# Patient Record
Sex: Male | Born: 1940
Health system: Southern US, Community
[De-identification: ages and names within clinical notes are randomized; demographics above are authoritative.]

## PROBLEM LIST (undated history)

## (undated) DIAGNOSIS — B0221 Postherpetic geniculate ganglionitis: Secondary | ICD-10-CM

## (undated) DIAGNOSIS — T7840XA Allergy, unspecified, initial encounter: Secondary | ICD-10-CM

## (undated) DIAGNOSIS — F419 Anxiety disorder, unspecified: Secondary | ICD-10-CM

## (undated) DIAGNOSIS — E785 Hyperlipidemia, unspecified: Secondary | ICD-10-CM

## (undated) DIAGNOSIS — K219 Gastro-esophageal reflux disease without esophagitis: Secondary | ICD-10-CM

## (undated) DIAGNOSIS — R000 Tachycardia, unspecified: Secondary | ICD-10-CM

## (undated) HISTORY — DX: Hyperlipidemia, unspecified: E78.5

## (undated) HISTORY — DX: Gastro-esophageal reflux disease without esophagitis: K21.9

## (undated) HISTORY — DX: Postherpetic geniculate ganglionitis: B02.21

## (undated) HISTORY — DX: Allergy, unspecified, initial encounter: T78.40XA

## (undated) HISTORY — PX: COLONOSCOPY: SHX174

## (undated) HISTORY — DX: Anxiety disorder, unspecified: F41.9

## (undated) HISTORY — PX: TRANSURETHRAL RESECTION OF PROSTATE: SHX73

## (undated) HISTORY — DX: Tachycardia, unspecified: R00.0

## (undated) HISTORY — PX: HERNIA REPAIR: SHX51

---

## 2000-12-02 ENCOUNTER — Encounter: Payer: Self-pay | Admitting: Emergency Medicine

## 2000-12-02 ENCOUNTER — Emergency Department (HOSPITAL_COMMUNITY): Admission: EM | Admit: 2000-12-02 | Discharge: 2000-12-02 | Payer: Self-pay | Admitting: Emergency Medicine

## 2004-08-13 ENCOUNTER — Ambulatory Visit: Payer: Self-pay | Admitting: Internal Medicine

## 2004-08-20 ENCOUNTER — Ambulatory Visit: Payer: Self-pay | Admitting: Internal Medicine

## 2006-09-01 DIAGNOSIS — B0221 Postherpetic geniculate ganglionitis: Secondary | ICD-10-CM

## 2006-09-01 HISTORY — DX: Postherpetic geniculate ganglionitis: B02.21

## 2007-02-06 ENCOUNTER — Ambulatory Visit: Payer: Self-pay | Admitting: Internal Medicine

## 2007-02-06 ENCOUNTER — Inpatient Hospital Stay (HOSPITAL_COMMUNITY): Admission: EM | Admit: 2007-02-06 | Discharge: 2007-02-08 | Payer: Self-pay | Admitting: Emergency Medicine

## 2007-02-07 ENCOUNTER — Ambulatory Visit: Payer: Self-pay | Admitting: Infectious Diseases

## 2009-08-23 ENCOUNTER — Encounter (INDEPENDENT_AMBULATORY_CARE_PROVIDER_SITE_OTHER): Payer: Self-pay | Admitting: *Deleted

## 2010-01-04 ENCOUNTER — Encounter (INDEPENDENT_AMBULATORY_CARE_PROVIDER_SITE_OTHER): Payer: Self-pay | Admitting: *Deleted

## 2010-01-09 ENCOUNTER — Encounter (INDEPENDENT_AMBULATORY_CARE_PROVIDER_SITE_OTHER): Payer: Self-pay

## 2010-01-11 ENCOUNTER — Ambulatory Visit: Payer: Self-pay | Admitting: Internal Medicine

## 2010-01-17 ENCOUNTER — Telehealth: Payer: Self-pay | Admitting: Internal Medicine

## 2010-03-07 ENCOUNTER — Ambulatory Visit: Payer: Self-pay | Admitting: Internal Medicine

## 2010-03-09 ENCOUNTER — Encounter: Payer: Self-pay | Admitting: Internal Medicine

## 2010-06-05 ENCOUNTER — Telehealth: Payer: Self-pay | Admitting: Internal Medicine

## 2010-06-05 DIAGNOSIS — K219 Gastro-esophageal reflux disease without esophagitis: Secondary | ICD-10-CM

## 2010-06-06 ENCOUNTER — Encounter (INDEPENDENT_AMBULATORY_CARE_PROVIDER_SITE_OTHER): Payer: Self-pay | Admitting: *Deleted

## 2010-06-07 ENCOUNTER — Ambulatory Visit: Payer: Self-pay | Admitting: Internal Medicine

## 2010-06-13 ENCOUNTER — Ambulatory Visit: Payer: Self-pay | Admitting: Internal Medicine

## 2010-06-13 LAB — CONVERTED CEMR LAB: UREASE: NEGATIVE

## 2010-06-14 ENCOUNTER — Encounter: Payer: Self-pay | Admitting: Internal Medicine

## 2010-07-24 ENCOUNTER — Ambulatory Visit: Payer: Self-pay | Admitting: Internal Medicine

## 2010-07-24 DIAGNOSIS — K222 Esophageal obstruction: Secondary | ICD-10-CM

## 2010-07-24 DIAGNOSIS — R1013 Epigastric pain: Secondary | ICD-10-CM

## 2010-07-30 ENCOUNTER — Ambulatory Visit (HOSPITAL_COMMUNITY)
Admission: RE | Admit: 2010-07-30 | Discharge: 2010-07-30 | Payer: Self-pay | Source: Home / Self Care | Admitting: Internal Medicine

## 2010-10-01 NOTE — Miscellaneous (Signed)
Summary: omeprazole prescription  Clinical Lists Changes  Medications: Added new medication of OMEPRAZOLE 40 MG  CPDR (OMEPRAZOLE) 1 each day 30 minutes before meal - Signed Rx of OMEPRAZOLE 40 MG  CPDR (OMEPRAZOLE) 1 each day 30 minutes before meal;  #30 x 11;  Signed;  Entered by: Laverna Peace RN;  Authorized by: Hilarie Fredrickson MD;  Method used: Electronically to [facility] [address] [place], [place], [place]  [id number], Ph: [phone], Fax: [phone]    Prescriptions: OMEPRAZOLE 40 MG  CPDR (OMEPRAZOLE) 1 each day 30 minutes before meal  #30 x 11   Entered by:   Laverna Peace RN   Authorized by:   Hilarie Fredrickson MD   Signed by:   Laverna Peace RN on 06/13/2010   Method used:   Electronically to        [facility] (retail)       [address]       [place], [place]  [id number]       Ph: [phone]       Fax: [phone]   RxID:   [id number]

## 2010-10-01 NOTE — Letter (Signed)
Summary: EGD Instructions  [facility]  [address] [place], [place] [address]   Phone: [phone]  Fax: [phone]       Timothy Horne    [birth year]    MRN: [id number]       Procedure Day Timothy Horne:  Timothy Horne  06/13/10     Arrival Time:  10:30AM     Procedure Time:  11:30AM     Location of Procedure:                    _ X _ [facility] (4th Floor)   PREPARATION FOR ENDOSCOPY   On 06/13/10 THE DAY OF THE PROCEDURE:  1.   No solid foods, milk or milk products are allowed after midnight the night before your procedure.  2.   Do not drink anything colored red or purple.  Avoid juices with pulp.  No orange juice.  3.  You may drink clear liquids until 9:30am, which is 2 hours before your procedure.                                                                                                CLEAR LIQUIDS INCLUDE: Water Jello Ice Popsicles Tea (sugar ok, no milk/cream) Powdered fruit flavored drinks Coffee (sugar ok, no milk/cream) Gatorade Juice: apple, white grape, white cranberry  Lemonade Clear bullion, consomm, broth Carbonated beverages (any kind) Strained chicken noodle soup Hard Candy   MEDICATION INSTRUCTIONS  Unless otherwise instructed, you should take regular prescription medications with a small sip of water as early as possible the morning of your procedure.       OTHER INSTRUCTIONS  You will need a responsible adult at least 70 years of age to accompany you and drive you home.   This person must remain in the waiting room during your procedure.  Wear loose fitting clothing that is easily removed.  Leave jewelry and other valuables at home.  However, you may wish to bring a book to read or an iPod/MP3 player to listen to music as you wait for your procedure to start.  Remove all body piercing jewelry and leave at home.  Total time from sign-in until discharge is approximately 2-3 hours.  You should go home  directly after your procedure and rest.  You can resume normal activities the day after your procedure.  The day of your procedure you should not:   Drive   Make legal decisions   Operate machinery   Drink alcohol   Return to work  You will receive specific instructions about eating, activities and medications before you leave.    The above instructions have been reviewed and explained to me by   Wyona Almas RN  June 07, 2010 2:03 PM     I fully understand and can verbalize these instructions _____________________________ Date _________

## 2010-10-01 NOTE — Miscellaneous (Signed)
Summary: clotest  Clinical Lists Changes  Orders: Added new Test order of TLB-H Pylori Screen Gastric Biopsy (83013-CLOTEST) - Signed 

## 2010-10-01 NOTE — Letter (Signed)
Summary: Appt Reminder 2  [facility]  [address] [place], [place] [address]   Phone: [phone]  Fax: [phone]        June 14, 2010 MRN: [id number]    [facility] [address] [place], [place]  [address]    Dear Mr. Appenzeller,   You have a return appointment with Dr. Wilhemina Bonito. Perry on 07/22/10 at 8:30  am.  Please remember to bring a complete list of the medicines you are taking, your insurance card and your co-pay.  If you have to cancel or reschedule this appointment, please call before 5:00 pm the evening before to avoid a cancellation fee.  If you have any questions or concerns, please call [phone].    Sincerely,    Milford Cage NCMA

## 2010-10-01 NOTE — Letter (Signed)
Summary: [name] Instructions  [name]  [address] [place], [place] [address]   Phone: [phone]  Fax: [phone]       Timothy Horne    [birth year]    MRN: [id number]        Procedure Day [name]:  Timothy Horne  01/18/10     Arrival Time:  8:00AM     Procedure Time:  9:00AM     Location of Procedure:                    _ X_  [facility] (4th Floor)                      PREPARATION FOR COLONOSCOPY WITH MOVIPREP   Starting 5 days prior to your procedure 5/15/11do not eat nuts, seeds, popcorn, corn, beans, peas,  salads, or any raw vegetables.  Do not take any fiber supplements (e.g. Metamucil, Citrucel, and Benefiber).  THE DAY BEFORE YOUR PROCEDURE         DATE: 01/17/10  DAY: THURSDAY  1.  Drink clear liquids the entire day-NO SOLID FOOD  2.  Do not drink anything colored red or purple.  Avoid juices with pulp.  No orange juice.  3.  Drink at least 64 oz. (8 glasses) of fluid/clear liquids during the day to prevent dehydration and help the prep work efficiently.  CLEAR LIQUIDS INCLUDE: Water Jello Ice Popsicles Tea (sugar ok, no milk/cream) Powdered fruit flavored drinks Coffee (sugar ok, no milk/cream) Gatorade Juice: apple, white grape, white cranberry  Lemonade Clear bullion, consomm, broth Carbonated beverages (any kind) Strained chicken noodle soup Hard Candy                             4.  In the morning, mix first dose of MoviPrep solution:    Empty 1 Pouch A and 1 Pouch B into the disposable container    Add lukewarm drinking water to the top line of the container. Mix to dissolve    Refrigerate (mixed solution should be used within 24 hrs)  5.  Begin drinking the prep at 5:00 p.m. The MoviPrep container is divided by 4 marks.   Every 15 minutes drink the solution down to the next mark (approximately 8 oz) until the full liter is complete.   6.  Follow completed prep with 16 oz of clear liquid of your choice (Nothing  red or purple).  Continue to drink clear liquids until bedtime.  7.  Before going to bed, mix second dose of MoviPrep solution:    Empty 1 Pouch A and 1 Pouch B into the disposable container    Add lukewarm drinking water to the top line of the container. Mix to dissolve    Refrigerate  THE DAY OF YOUR PROCEDURE      DATE: 01/18/10  DAY: FRIDAY  Beginning at 4:00AM (5 hours before procedure):         1. Every 15 minutes, drink the solution down to the next mark (approx 8 oz) until the full liter is complete.  2. Follow completed prep with 16 oz. of clear liquid of your choice.    3. You may drink clear liquids until 7:00AM (2 HOURS BEFORE PROCEDURE).   MEDICATION INSTRUCTIONS  Unless otherwise instructed, you should take regular prescription medications with a small sip of water   as early as possible the morning of your procedure.  OTHER INSTRUCTIONS  You will need a responsible adult at least 70 years of age to accompany you and drive you home.   This person must remain in the waiting room during your procedure.  Wear loose fitting clothing that is easily removed.  Leave jewelry and other valuables at home.  However, you may wish to bring a book to read or  an iPod/MP3 player to listen to music as you wait for your procedure to start.  Remove all body piercing jewelry and leave at home.  Total time from sign-in until discharge is approximately 2-3 hours.  You should go home directly after your procedure and rest.  You can resume normal activities the  day after your procedure.  The day of your procedure you should not:   Drive   Make legal decisions   Operate machinery   Drink alcohol   Return to work  You will receive specific instructions about eating, activities and medications before you leave.    The above instructions have been reviewed and explained to me by   [name]  Jan 11, 2010 2:53 PM_    I fully understand and can  verbalize these instructions _____________________________ Date _________

## 2010-10-01 NOTE — Letter (Signed)
Summary: Patient Notice- Polyp Results  [facility]  [address] [place], [place] [address]   Phone: [phone]  Fax: [phone]        March 09, 2010 MRN: [id number]    [facility] [address] [place], [place]  [address]    Dear Mr. Seeber,  I am pleased to inform you that the colon polyp(s) removed during your recent colonoscopy was (were) found to be benign (no cancer detected) upon pathologic examination.  I recommend you have a repeat colonoscopy examination in 5 years to look for recurrent polyps, as having colon polyps increases your risk for having recurrent polyps or even colon cancer in the future.  Should you develop new or worsening symptoms of abdominal pain, bowel habit changes or bleeding from the rectum or bowels, please schedule an evaluation with either your primary care physician or with me.  Additional information/recommendations:  __ No further action with gastroenterology is needed at this time. Please      follow-up with your primary care physician for your other healthcare      needs.   Please call us if you are having persistent problems or have questions about your condition that have not been fully answered at this time.  Sincerely,  Hilarie Fredrickson MD  This letter has been electronically signed by your physician.  Appended Document: Patient Notice- Polyp Results letter mailed .

## 2010-10-01 NOTE — Progress Notes (Signed)
Summary: cx fee?  Phone Note Call from Patient   Caller: Patient Call For: Dr. Marina Goodell Reason for Call: Talk to Doctor Summary of Call: pt canceled his procedure sch'ed for tomorrow due to his wife having to be admitted to the hospital today and will be staying over night... pt did resch for late in June  Dr. Marina Goodell, do you wish to charge this pt? Initial call taken by: Vallarie Mare,  Jan 17, 2010 9:49 AM  Follow-up for Phone Call        no Follow-up by: Hilarie Fredrickson MD,  Jan 17, 2010 9:51 AM  Additional Follow-up for Phone Call Additional follow up Details #1::        Patient NOT BILLED. Additional Follow-up by: Leanor Kail [facility],  Jan 22, 2010 8:59 AM

## 2010-10-01 NOTE — Assessment & Plan Note (Signed)
Summary: Followup post EGD (epigastric pain and dysphagia)   History of Present Illness Visit Type: follow up Primary GI MD: Yancey Flemings MD Primary Provider: Sondra Come, MD Chief Complaint: f/u EGD, still having epigastric and upper abd pain that can last 2 hours at a time. Pt does have some indigestion.  History of Present Illness:   70 year old with GERD, peptic stricture, adenomatous colon polyps, and chronic dyspeptic symptoms. He underwent surveillance colonoscopy in July 2011. Small tubular adenomas removed. Follow up in 5 years recommended. He subsequently contacted the office complaining of abdominal complaints and dysphagia. He subsequently underwent upper endoscopy October 13. He was found to have distal esophagitis, a benign esophageal stricture, and mild duodenitis. Testing for H. pylori was negative. Omeprazole 40 mg daily prescribed. Abdominal ultrasound requested. Outpatient followup at this time. Patient tells me that he continues to have some epigastric and upper abdominal discomfort that can last for 2 hours at a time. He's had this problem chronically for several years. She does mention that his dysphagia has resolved post esophageal dilation Elease Hashimoto [address] [place] dilator). He thinks the omeprazole resulted in more abdominal discomfort. Thus, he is on no acid suppressive therapy . No complaints of weight loss or bleeding.Marland Kitchen   GI Review of Systems    Reports abdominal pain and  belching.     Location of  Abdominal pain: epigastric area and uper abd.    Denies acid reflux, chest pain, dysphagia with liquids, dysphagia with solids, heartburn, loss of appetite, nausea, vomiting, vomiting blood, weight loss, and  weight gain.        Denies anal fissure, black tarry stools, change in bowel habit, constipation, diarrhea, diverticulosis, fecal incontinence, heme positive stool, hemorrhoids, irritable bowel syndrome, jaundice, light color stool, liver problems, rectal bleeding, and  rectal  pain. Preventive Screening-Counseling & Management  Alcohol-Tobacco     Smoking Status: never      Drug Use:  no.      Current Medications (verified): 1)  Tenormin 50 Mg Tabs (Atenolol) .... One Capsule By Mouth Once Daily  Allergies (verified): No Known Drug Allergies  Past History:  Past Medical History: Reviewed history from 07/23/2010 and no changes required. Diverticulosis Hemorrhoids Kidney Stones Hx. of Esophageal Stricture GERD Colon Polyps-Tubular Adenoma  Hx. of Hyperplastic   Past Surgical History: Reviewed history from 07/23/2010 and no changes required. Deviated Septum Repair Inguinal Hernia Repair  Family History: Family History of Stomach Cancer:Aunt  Social History: Married Patient has never smoked.  Alcohol Use - yes Illicit Drug Use - no Smoking Status:  never Drug Use:  no  Review of Systems  The patient denies allergy/sinus, anemia, anxiety-new, arthritis/joint pain, back pain, blood in urine, breast changes/lumps, change in vision, confusion, cough, coughing up blood, depression-new, fainting, fatigue, fever, headaches-new, hearing problems, heart murmur, heart rhythm changes, itching, menstrual pain, muscle pains/cramps, night sweats, nosebleeds, pregnancy symptoms, shortness of breath, skin rash, sleeping problems, sore throat, swelling of feet/legs, swollen lymph glands, thirst - excessive , urination - excessive , urination changes/pain, urine leakage, vision changes, and voice change.    Vital Signs:  Patient profile:   70 year old male Height:      71 inches Weight:      168.38 pounds BMI:     23.57 Pulse rate:   88 / minute Pulse rhythm:   regular BP sitting:   158 / 82  (right arm) Cuff size:   regular  Vitals Entered By: Christie Nottingham CMA Duncan Dull) (July 24, 2010 11:37 AM)  Physical Exam  General:  Well developed, well nourished, no acute distress. Head:  Normocephalic and atraumatic. Eyes:  PERRLA, no icterus. Mouth:   No deformity or lesions. Neck:  Supple; no masses or thyromegaly. Lungs:  Clear throughout to auscultation. Heart:  Regular rate and rhythm; no murmurs, rubs,  or bruits. Abdomen:  Soft, nontender and nondistended. No masses, hepatosplenomegaly or hernias noted. Normal bowel sounds. Pulses:  Normal pulses noted. Neurologic:  Alert and  oriented x4. Skin:  Intact without significant lesions or rashes. Psych:  Alert and cooperative. Normal mood and affect.   Impression & Recommendations:  Problem # 1:  ABDOMINAL PAIN, EPIGASTRIC (ICD-789.06) ongoing dyspeptic complaints. Service some of his symptoms may be due to GERD. He has been on no reasonable last suppressive therapy as he feels PPIs neck symptoms worse (despite using for only short periods of time).  Plan: #1. Prescribed ranitidine 150 mg p.o. b.i.d. #2. Scheduled normal ultrasound to evaluate for other causes of discomfort #3. If ultrasound negative, then followup p.r.n.  Problem # 2:  ESOPHAGEAL STRICTURE (ICD-530.3) dysphagia resolved post dilation. Repeat dilation p.r.n. recurrent dysphagia  Problem # 3:  GERD (ICD-530.81) endoscopic evidence of esophagitis as well as peptic stricture.  Plan: #1. Prescribed ranitidine as patient unwilling to take PPI #2. Reflux precautions #3. Follow p.r.n.  Other Orders: Ultrasound Abdomen (UAS)  Patient Instructions: 1)  Abdominal Ultrasound [facility] 07/30/10 11:00 am arrive at 10:45 am 2)  Zantac 150 mg two times a day buy over the counter. 3)  Copy sent to : Sondra Come, MD 4)  The medication list was reviewed and reconciled.  All changed / newly prescribed medications were explained.  A complete medication list was provided to the patient / caregiver.

## 2010-10-01 NOTE — Procedures (Signed)
Summary: Colonoscopy  Patient: Timothy Horne Note: All result statuses are Final unless otherwise noted.  Tests: (1) Colonoscopy (COL)   COL Colonoscopy           DONE     [facility]     [address]     [place], [place]  [address]           COLONOSCOPY PROCEDURE REPORT           PATIENT:  Timothy Horne, Timothy Horne  MR#:  [id number]     BIRTHDATE:  [birth year], 68 yrs. old  GENDER:  male     ENDOSCOPIST:  Wilhemina Bonito. Eda Keys, MD     REF. BY:  Surveillance Program Recall,     PROCEDURE DATE:  03/07/2010     PROCEDURE:  Colonoscopy with snare polypectomy x 3     ASA CLASS:  Class II     INDICATIONS:  surveillance and high-risk screening, history of     pre-cancerous (adenomatous) colon polyps ; 08-2004 w/ TA     MEDICATIONS:   Fentanyl 125 mcg IV, Versed 12 mg IV           DESCRIPTION OF PROCEDURE:   After the risks benefits and     alternatives of the procedure were thoroughly explained, informed     consent was obtained.  Digital rectal exam was performed and     revealed no abnormalities.   The LB CF-H180AL [id number] endoscope     was introduced through the anus and advanced to the cecum, which     was identified by both the appendix and ileocecal valve, without     limitations.Time to cecum = 3:09 min.  The quality of the prep was     excellent, using MoviPrep.  The instrument was then slowly     withdrawn (time = 11:51 min.) as the colon was fully examined.     <<PROCEDUREIMAGES>>           FINDINGS:  Three polyps (all <34mm) were found in the transverse,     descending, and sigmoid colon. Polyps were snared without cautery.     Retrieval was successful.  Moderate diverticulosis was found in     the sigmoid colon.  This was otherwise a normal examination of the     colon.   Retroflexed views in the rectum revealed internal     hemorrhoids.    The scope was then withdrawn from the patient and     the procedure completed.           COMPLICATIONS:  None     ENDOSCOPIC  IMPRESSION:     1) Three polyps - removed     2) Moderate diverticulosis in the sigmoid colon     3) Otherwise normal examination     4) Internal hemorrhoids           RECOMMENDATIONS:     1) Follow up colonoscopy in 3 years if all polyps adenomas;     otherwise 5 years           ______________________________     Wilhemina Bonito. Eda Keys, MD           CC:  Sondra Come MD [facility]); The Patient           n.     eSIGNED:   Tkeya Stencil N. Eda Keys at 03/07/2010 12:43 PM           Gracia, Chase Picket, [id number]  Note: An  exclamation mark (!) indicates a result that was not dispersed into the flowsheet. Document Creation Date: 03/07/2010 12:45 PM _______________________________________________________________________  (1) Order result status: Final Collection or observation date-time: 03/07/2010 12:36 Requested date-time:  Receipt date-time:  Reported date-time:  Referring Physician:   Ordering Physician: Fransico Setters [phone]) Specimen Source:  Source: Launa Grill Order Number: [id number] Lab site:   Appended Document: Colonoscopy recall in 5 yr     Procedures Next Due Date:    Colonoscopy: 03/2015

## 2010-10-01 NOTE — Procedures (Signed)
Summary: Upper Endoscopy  Patient: Timothy Horne Note: All result statuses are Final unless otherwise noted.  Tests: (1) Upper Endoscopy (EGD)   EGD Upper Endoscopy       DONE     [facility]     [address]     [place], [place]  [address]           ENDOSCOPY PROCEDURE REPORT           PATIENT:  [name], [name]  MR#:  [id number]     BIRTHDATE:  18-Aug-1941, 69 yrs. old  GENDER:  male           ENDOSCOPIST:  Wilhemina Bonito. Eda Keys, MD     Referred by:  .Direct           PROCEDURE DATE:  06/13/2010     PROCEDURE:  EGD with biopsy,     Maloney Dilation of Esophagus - 30F           ASA CLASS:  Class II           INDICATIONS:  epigastric pain, dysphagia ; Intermittent epigastric     pain lasting 2-3 hours once every several months for the past     year; int. solid food dysphagia; also c/o "burning" and belching           MEDICATIONS:   Fentanyl 75 mcg IV, Versed 9 mg IV     TOPICAL ANESTHETIC:  Exactacain Spray           DESCRIPTION OF PROCEDURE:   After the risks benefits and     alternatives of the procedure were thoroughly explained, informed     consent was obtained.  The LB GIF-H180 [id number] endoscope was     introduced through the mouth and advanced to the second portion of     the duodenum, without limitations.  The instrument was slowly     withdrawn as the mucosa was fully examined.     <<PROCEDUREIMAGES>>           Esophagitis (erythema and edema) was found in the distal     esophagus.  A 15mm benign ringlike stricture was found in the     distal esophagus.  The stomach was entered and closely examined.     The antrum, angularis, and lesser curvature were well visualized,     including a retroflexed view of the cardia and fundus. The stomach     wall was normally distensable. The scope passed easily through the     pylorus into the duodenum.  Duodenitis was found in the bulb of     the duodenum. Clo bx taken. Normal D2.   Retroflexed views     revealed no  abnormalities.    The scope was then withdrawn from     the patient and the procedure completed.           THERAPY: 54 F MALONEY DILATOR PASSED W/ MINIMAL RESISTANCE AND NO     HEME. TOLERATED WELL           COMPLICATIONS:  None           ENDOSCOPIC IMPRESSION:     1) Esophagitis in the distal esophagus     2) Stricture in the distal esophagus - s/p dilation 30F     3) Normal stomach     4) Duodenitis in the bulb of duodenum     5) GERD     RECOMMENDATIONS:  1) OMEPRAZOLE 40MG  PO QD; #30; 11 REFILLS     2) Rx CLO if positive     3) My office will arrange for you to have an abdominal     ultrasound performed "EPIGASRTIC PAIN".     4) OP OFFICE follow-up in 4-6  weeks WITH DR Marina Goodell.           ______________________________     Wilhemina Bonito. Eda Keys, MD           CC:  The Patient; Cain Saupe, MD ([facility])           n.     eSIGNED:   Wilhemina Bonito. Eda Keys at 06/13/2010 12:22 PM           Mitter, [place], [id number]  Note: An exclamation mark (!) indicates a result that was not dispersed into the flowsheet. Document Creation Date: 06/13/2010 12:23 PM _______________________________________________________________________  (1) Order result status: Final Collection or observation date-time: 06/13/2010 12:12 Requested date-time:  Receipt date-time:  Reported date-time:  Referring Physician:   Ordering Physician: Fransico Setters [phone]) Specimen Source:  Source: Launa Grill Order Number: [id number] Lab site:

## 2010-10-01 NOTE — Letter (Signed)
Summary: Previsit letter  [facility]  [address] [place], [place] [address]   Phone: [phone]  Fax: [phone]       01/04/2010 MRN: [id number]  [facility] [address] [place], [place]  [address]  Dear Mr. Timothy Horne,  Welcome to the Gastroenterology Division at [facility].    You are scheduled to see a nurse for your pre-procedure visit on 01/11/2010 at 2:30pm on the 3rd floor at [facility], [address]  We ask that you try to arrive at our office 15 minutes prior to your appointment time to allow for check-in.  Your nurse visit will consist of discussing your medical and surgical history, your immediate family medical history, and your medications.    Please bring a complete list of all your medications or, if you prefer, bring the medication bottles and we will list them.  We will need to be aware of both prescribed and over the counter drugs.  We will need to know exact dosage information as well.  If you are on blood thinners (Coumadin, Plavix, Aggrenox, Ticlid, etc.) please call our office today/prior to your appointment, as we need to consult with your physician about holding your medication.   Please be prepared to read and sign documents such as consent forms, a financial agreement, and acknowledgement forms.  If necessary, and with your consent, a friend or relative is welcome to sit-in on the nurse visit with you.  Please bring your insurance card so that we may make a copy of it.  If your insurance requires a referral to see a specialist, please bring your referral form from your primary care physician.  No co-pay is required for this nurse visit.     If you cannot keep your appointment, please call [phone] to cancel or reschedule prior to your appointment date.  This allows [place] the opportunity to schedule an appointment for another patient in need of care.    Thank you for choosing [facility] for your  medical needs.  We appreciate the opportunity to care for you.  Please visit [place] at our website  to learn more about our practice.                     Sincerely.                                                                                                                   The Gastroenterology Division

## 2010-10-01 NOTE — Progress Notes (Signed)
Summary: Sch'd Endo  Phone Note Call from Patient Call back at Home Phone [phone]   Caller: Patient Call For: Dr. Marina Goodell Reason for Call: Talk to Nurse Summary of Call: Pt. said last OV he talked to Dr. Marina Goodell about a direct Endo...wants to know if he can sch'd directly Initial call taken by: Karna Christmas,  June 05, 2010 10:40 AM  Follow-up for Phone Call        Dr Marina Goodell this pt had a colon 03/07/10 and says that you ok'd a direct EGD.  Is this ok to schedule directly or would you like to see him in the office first? Follow-up by: Chales Abrahams CMA Duncan Dull),  June 05, 2010 11:12 AM  Additional Follow-up for Phone Call Additional follow up Details #1::        Find out why we wanted to do an EGD. What reason? Thanks.Hilarie Fredrickson MD  June 05, 2010 11:45 AM   New Problems: GERD (ICD-530.81)   Additional Follow-up for Phone Call Additional follow up Details #2::    Pt has a burning in his stomach that last for several hours.  Prilosec and Prevacid dose not help.  Occasionally has pain that feels like it goes from the stomach up to the throat.  Chales Abrahams CMA (AAMA)  June 05, 2010 1:21 PM   YES. OK TO SET UP EGD. Hilarie Fredrickson MD  June 05, 2010 2:10 PM  pt scheduled for EGD and previsit. Chales Abrahams CMA Duncan Dull)  June 05, 2010 2:26 PM    New Problems: GERD (ICD-530.81)

## 2010-10-01 NOTE — Miscellaneous (Signed)
Summary: [facility] Previsit/prep  Clinical Lists Changes  Observations: Added new observation of NKA: T (06/07/2010 13:34)

## 2010-10-01 NOTE — Miscellaneous (Signed)
Summary: [facility] previsit  Clinical Lists Changes  Medications: Added new medication of MOVIPREP 100 GM  SOLR (PEG-KCL-NACL-NASULF-NA ASC-C) As per prep instructions. - Signed Rx of MOVIPREP 100 GM  SOLR (PEG-KCL-NACL-NASULF-NA ASC-C) As per prep instructions.;  #1 x 0;  Signed;  Entered by: [name];  Authorized by: Hilarie Fredrickson MD;  Method used: Electronically to [facility] [address], [place], [place], [place]  [id number], Ph: [phone], Fax: [phone] Observations: Added new observation of NKA: T (01/11/2010 14:30)    Prescriptions: MOVIPREP 100 GM  SOLR (PEG-KCL-NACL-NASULF-NA ASC-C) As per prep instructions.  #1 x 0   Entered by:   [name]   Authorized by:   Hilarie Fredrickson MD   Signed by:   [name] on 01/11/2010   Method used:   Electronically to        [facility] (retail)       [address]       [place], [place]  [id number]       Ph: [phone]       Fax: [phone]   RxID:   [id number]

## 2011-01-14 NOTE — Discharge Summary (Signed)
NAMEELAI, VANWYK            ACCOUNT NO.:  [id number]   MEDICAL RECORD NO.:  [id number]          PATIENT TYPE:  INP   LOCATION:  2004                         FACILITY[facility]  [facility]   PHYSICIAN:  Dellia Beckwith, M.D. DATE OF BIRTH:  [birth year]   DATE OF ADMISSION:  02/06/2007  DATE OF DISCHARGE:  02/08/2007                               DISCHARGE SUMMARY   CHIEF COMPLAINT:  Ear pain, dizziness, paced changes and loss of  appetite.   DISCHARGE DIAGNOSES:  Primary:  1. 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  Secondary:  1. Aseptic meningitis.  2. Degenerative disc disease.  3. History of renal stones.  4. Status post hernia repair.  5. Status post transurethral prostatectomy.   DISCHARGE MEDICATIONS:  1. Tenormin 50 mg 1 per day.  2. Allegra 90 mg 1 per day.  3. Amitriptyline 50 mg 1 time each night.  4. Acyclovir 680 mg IV administered 1 dose every 8 hours for 19 days      (last dose on June 28).  5. Prednisone 60 mg 1 per day x4 days.   DISPOSITION AND FOLLOWUP:  1. Discharged to home with PICC line to receive IV acyclovir 3 times a      day, administered by home health for 19 days, and to receive      prednisone 60 mg orally for 4 days.  2. Follow up with Dr. Pearlean Brownie at [facility] immediately upon      discharge (June 9) for evaluation of blurry vision.  3. Follow up with Dr. Fara Boros at [facility].  Followup on      polyneuritis and aseptic meningitis, and check for VZV (Varicella-      Zoster virus) and HSV (herpes simplex virus) PCR ([employer] [place] of CSF [facility] [place]).  4. Also followup on chest x-ray findings of hyperinflation and      attenuation of the pulmonary vascular consistent with COPD; patient      is a lifetime nonsmoker with a family history of COPD.   PROCEDURES PERFORMED:  1. Lumbar puncture.  2. PICC line placement.   CONCLUSION:  Dr. Maurice March, infectious  disease.   ADMITTING H&P:  HPI:  On May 19, the patient began having pain behind  his left ear.  This continued until he awoke in the morning on May 21,  with left hearing loss, fever, dizziness and nausea.  He was seen at  urgent care, where he was given Cipro to treat a possible inner ear  infection.  Over the next few days, he continued to decline until he  reported to the ER at [facility] on May 24, where he was started  on acyclovir for shingles.  He continued to worsen and saw an ENT  physician on May 26, who told him he had inflammation of the cartilage  of the inner ear; he was started on prednisone and Keflex.  Following  this, his ear swelling improved, but he noted difficulty with a  accommodation, dizziness and abnormal taste.  On  June 2, he returned to  the ENT where he had an ESR and CBC drawn, both of which turned out to  be normal.  He also had an MRI, which showed enhancement of the left  cranial nerve, possibly representing an inflammation or a neuroma.   PHYSICAL EXAMINATION:  VITAL SIGNS:  Temperature 98.2, blood pressure  135/91, pulse 74, respiratory rate 16, O2 saturation 97% on room air.  GENERAL:  Uncomfortable appearing man lying in bed.  HEENT:  Eyes:  PERRLA, EOMI, nonicteric, right eye ptosis.  ENT:  Oropharynx clear, right TM clear, left TM with some erythema and  discoloration on TM, without any air fluid level, opacity, bulging,  retraction, or scarring.  No inner ear rashes.  NECK:  No thyromegaly.  No  lymphadenopathy.  Supple with full range of  motion.  RESPIRATORY:  Clear to auscultation bilaterally.  CV:  Regular rate and rhythm.  No murmurs, rubs or gallops.  GI:  Abdomen is soft, nontender, nondistended, positive bowel sounds.  EXTREMITIES:  No clubbing, cyanosis or edema.  SKIN:  No rashes.  NEURO:  Mental status intact and appropriate.  Weakness of closure of  the left eyelid.  Extraocular motor intact.  Facial sensation intact and   symmetric.  Hearing grossly intact.  Tongue mobile without  fasciculations or atrophy.  No deviation of palate.  Accessory muscles  5/5.  Strength 5/5 throughout.  Sensory grossly intact throughout.  Rapid alternating movements within normal limits.  Positive Romberg  sign.  No pronator drift.   ADMISSION LABORATORY DATA:  CBC:  White blood cells 8.4, hemoglobin  17.0, hematocrit 51.0, platelets 270.  Basic metabolic panel:  Sodium  134, potassium 4.3, chloride 102, bicarb 27, BUN 26, creatinine 1.1,  glucose 104.  CSF:  WBC 47.0, 92% lymphocytes, 0 PMNs, 0 eosinophils.  Glucose 61, protein 61, RBC 6.  Gram-stain showed 0 organisms.  VDRL  negative.  AFB negative.  HIV nonreactive.  RPR nonreactive.  Cryptococcal antigen negative.   HOSPITAL COURSE:  Patient was a 70 year old man with a 3 week history  significant for ear pain, nausea, vomiting, unilateral hearing loss,  imbalance, blurry vision and facial weakness.  He received an LP showing  elevated WBC with a lymphocytic predominance, elevated protein and  normal glucose.  These laboratory findings were consistent with aseptic  meningitis.  The patient had no history of NSAIDs use, IVDA, high-risk  sexual behavior, incarceration or foreign travel.  His CSF was tested  for markers of Syphilis, HIV, HSV, VZV and cryptococcus.  All of these  returned negative with the exception  of VZV and HSV, which are pending  at the time of discharge.  While the diagnosis of viral meningitis was  consistent with the LP findings, it did not explain his focal findings,  with suggestive cranial nerve pathology.  The symptoms described closely  resemble the constellation of symptoms described as Margaretha Sheffield  syndrome, the cranial neuropathy caused by HSV or VZV.  The patient was  begun on prednisone and acyclovir.  In light of the possibility of viral  meningitis, his antiviral therapy was changed to IV acyclovir, and a  PICC line was placed prior to  discharge.  Several of his symptoms  improved somewhat over the brief admission; however, he continued to  complain of blurry vision that improved-but did not resolve-when  covering 1 eye.  An appointment was made for him to see Dr. Pearlean Brownie at  [facility] immediately upon  discharge.  Followup was arranged  with Dr. Fara Boros with [facility] and this was  arranged for June 13.   DISCHARGE VITAL SIGNS:  Temperature 97.7, pulse 71, respiratory rate 18,  blood pressure 113/67, O2 saturation 95% on room air.   DISCHARGE DAY LABORATORIES:  CBC:  White blood cells 8.4, hemoglobin  13.4, hematocrit 39.7, platelets 204.   BMET:  Sodium 136, potassium 4.3, chloride 106, bicarb 26, BUN 17,  creatinine 0.9, glucose 99.      Dellia Beckwith, M.D.  Electronically Signed     VD/MEDQ  D:  02/08/2007  T:  02/08/2007  Job:  [id number]   cc:   Fara Boros, M.D.  Pearlean Brownie, M.D.

## 2011-06-19 LAB — URINALYSIS, MICROSCOPIC ONLY
Bilirubin Urine: NEGATIVE
Glucose, UA: NEGATIVE
Ketones, ur: NEGATIVE
Protein, ur: NEGATIVE
Urobilinogen, UA: 0.2

## 2011-06-19 LAB — AFB CULTURE WITH SMEAR (NOT AT ARMC): Acid Fast Smear: NONE SEEN

## 2011-06-19 LAB — CSF CULTURE W GRAM STAIN

## 2011-06-19 LAB — BASIC METABOLIC PANEL
CO2: 26
Calcium: 8.9
Chloride: 103
Chloride: 106
Creatinine, Ser: 1.17
GFR calc Af Amer: >60
GFR calc non Af Amer: >60
Glucose, Bld: 99
Potassium: 4.3
Sodium: 136
Sodium: 137

## 2011-06-19 LAB — URINE CULTURE
Colony Count: NO GROWTH
Culture: NO GROWTH

## 2011-06-19 LAB — CSF CELL COUNT WITH DIFFERENTIAL
Eosinophils, CSF: 0
Lymphs, CSF: 92 — ABNORMAL HIGH
Monocyte-Macrophage-Spinal Fluid: 8 — ABNORMAL LOW
RBC Count, CSF: 6 — ABNORMAL HIGH
Tube #: 4
WBC, CSF: 47 — ABNORMAL HIGH

## 2011-06-19 LAB — CBC
HCT: 39.7
HCT: 50.8
Hemoglobin: 13.4
MCHC: 33.7
MCV: 89.2
MCV: 89.8
Platelets: 270
RBC: 4.92
RDW: 12.6
WBC: 10.2
WBC: 8.4

## 2011-06-19 LAB — DIFFERENTIAL
Eosinophils Absolute: 0.1
Eosinophils Relative: 1
Lymphs Abs: 1.2
Lymphs Abs: 1.8
Monocytes Absolute: 0.7
Monocytes Relative: 2 — ABNORMAL LOW
Monocytes Relative: 8
Neutro Abs: 8.8 — ABNORMAL HIGH
Neutrophils Relative %: 86 — ABNORMAL HIGH

## 2011-06-19 LAB — CULTURE, BLOOD (ROUTINE X 2)

## 2011-06-19 LAB — CRYPTOCOCCAL ANTIGEN, CSF: Crypto Ag: NEGATIVE

## 2011-06-19 LAB — VDRL, CSF: VDRL Quant, CSF: NONREACTIVE

## 2011-06-19 LAB — I-STAT 8, (EC8 V) (CONVERTED LAB)
BUN: 26 — ABNORMAL HIGH
Chloride: 102
Glucose, Bld: 104 — ABNORMAL HIGH
Potassium: 4.3
pH, Ven: 7.427 — ABNORMAL HIGH

## 2011-06-19 LAB — HSV PCR: HSV, PCR: NEGATIVE

## 2011-06-19 LAB — COMPREHENSIVE METABOLIC PANEL
ALT: 36
BUN: 25 — ABNORMAL HIGH
CO2: 31
Calcium: 9.2
GFR calc non Af Amer: 57 — ABNORMAL LOW
Glucose, Bld: 117 — ABNORMAL HIGH
Sodium: 138

## 2011-06-19 LAB — SEDIMENTATION RATE: Sed Rate: 4

## 2011-06-19 LAB — HIV ANTIBODY (ROUTINE TESTING W REFLEX): HIV: NONREACTIVE

## 2011-06-19 LAB — POCT I-STAT CREATININE: Operator id: 291361

## 2011-06-19 LAB — GRAM STAIN

## 2011-06-19 LAB — VARICELLA-ZOSTER BY PCR: Varicella-Zoster, PCR: NEGATIVE

## 2011-06-19 LAB — PROTEIN, CSF: Total  Protein, CSF: 61 — ABNORMAL HIGH

## 2011-06-19 LAB — GLUCOSE, CSF: Glucose, CSF: 61

## 2011-07-06 IMAGING — US US ABDOMEN COMPLETE
1 series · 14 of 25 positions shown · non-contrast
Comparison: None

CLINICAL DATA: Epigastric pain.

COMPLETE ABDOMINAL ULTRASOUND

[Series 1: us abdomen complete · 0.23mm/px · 14 of 88 slices shown]
[im 1/88]
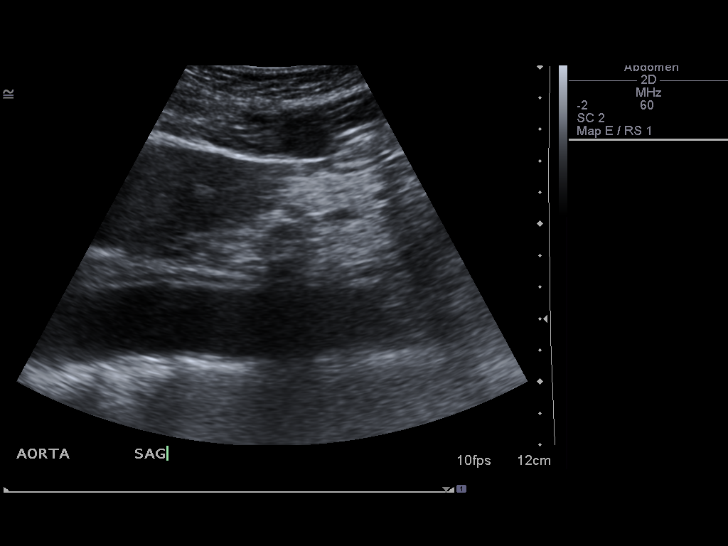
[im 8/88]
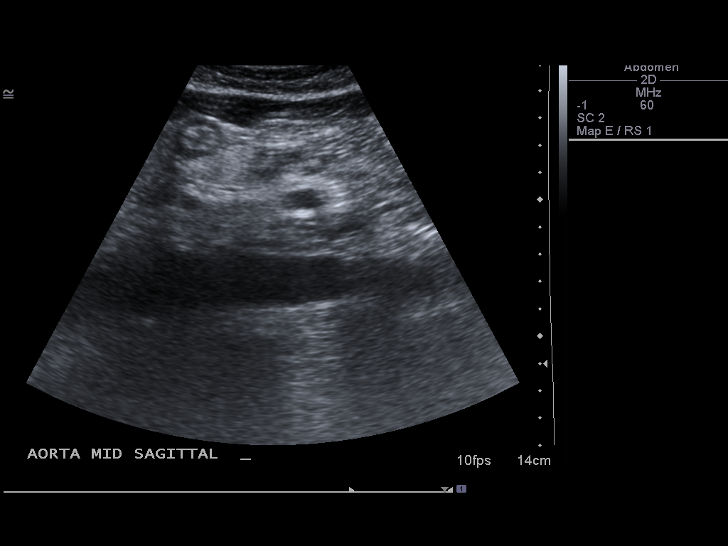
[im 15/88]
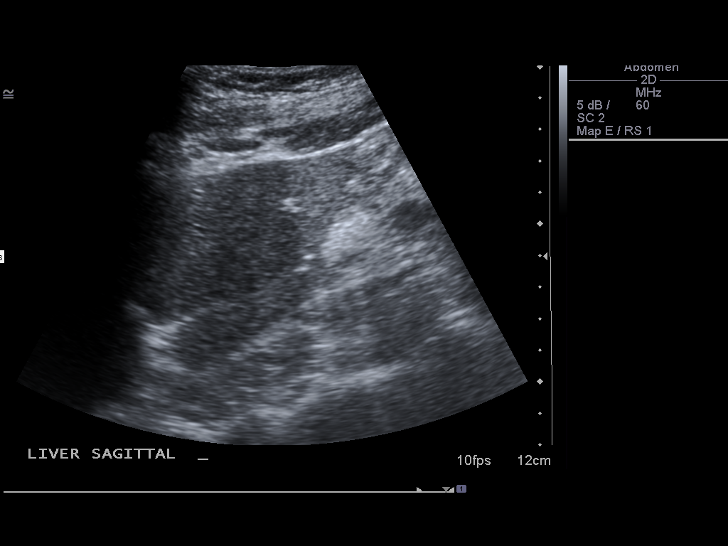
[im 22/88]
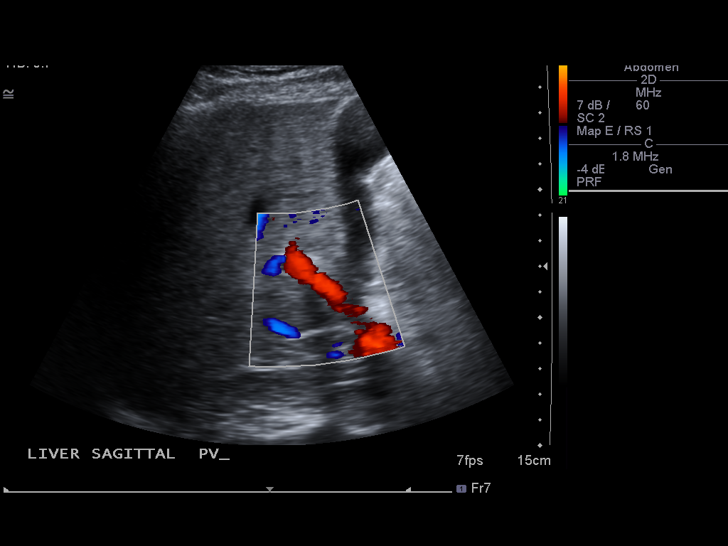
[im 30/88]
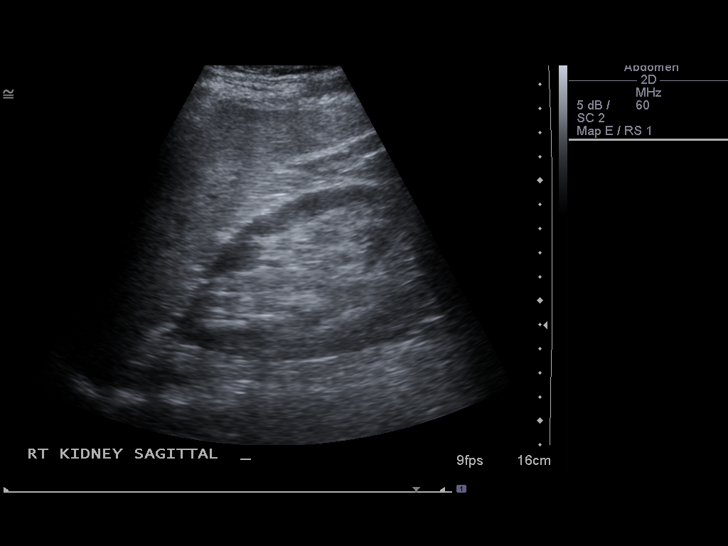
[im 33/88]
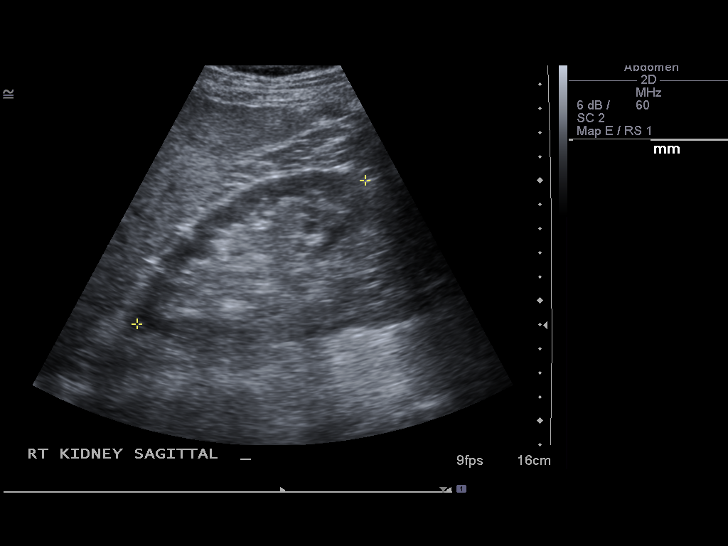
[im 40/88]
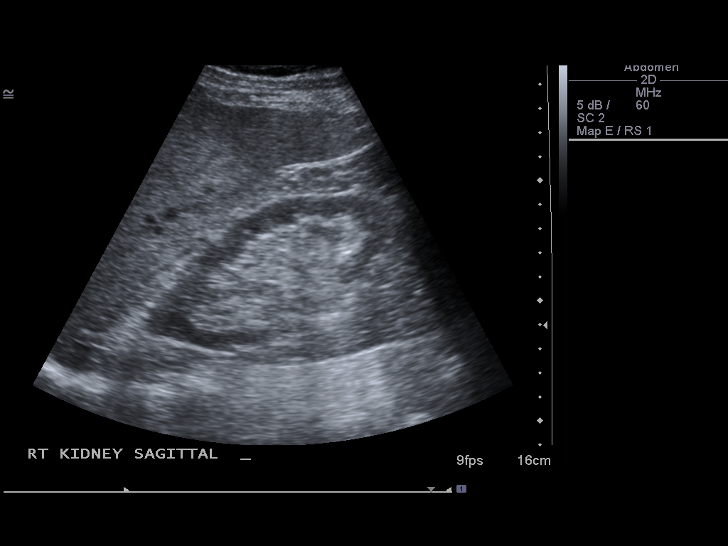
[im 48/88]
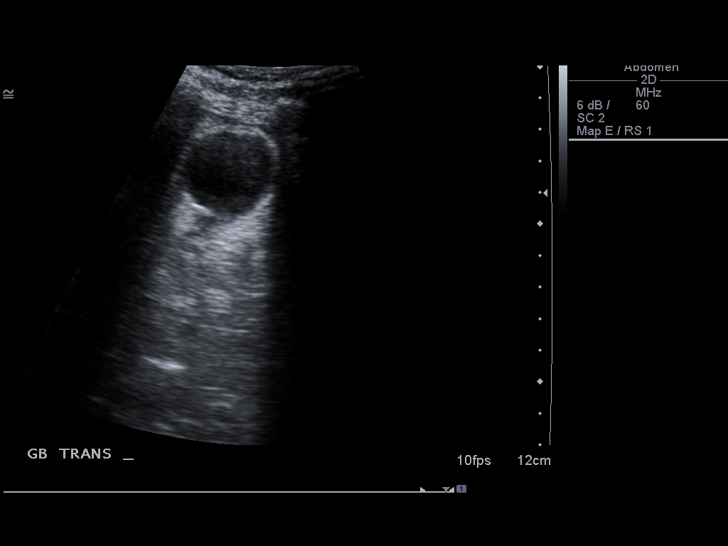
[im 55/88]
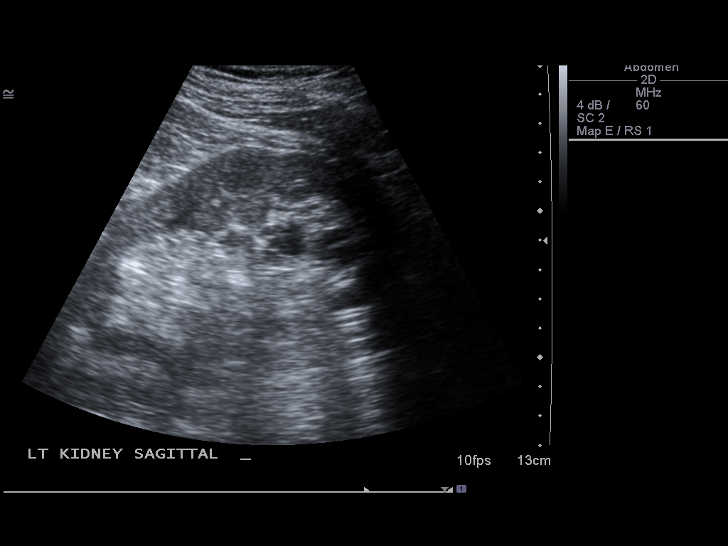
[im 59/88]
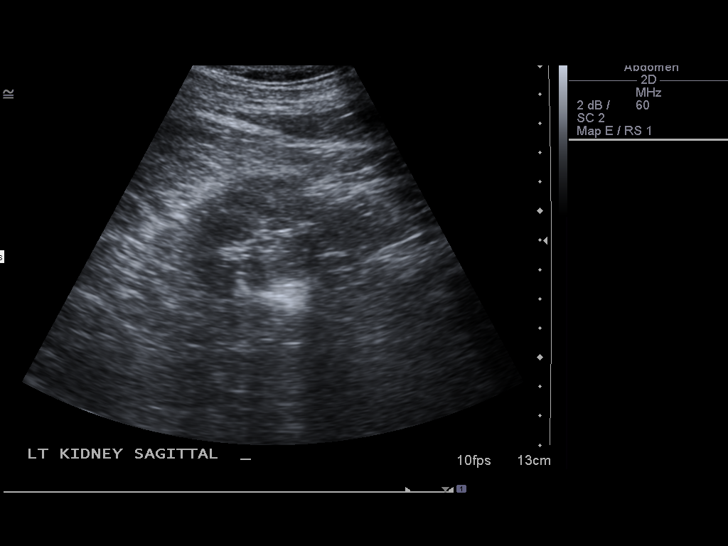
[im 66/88]
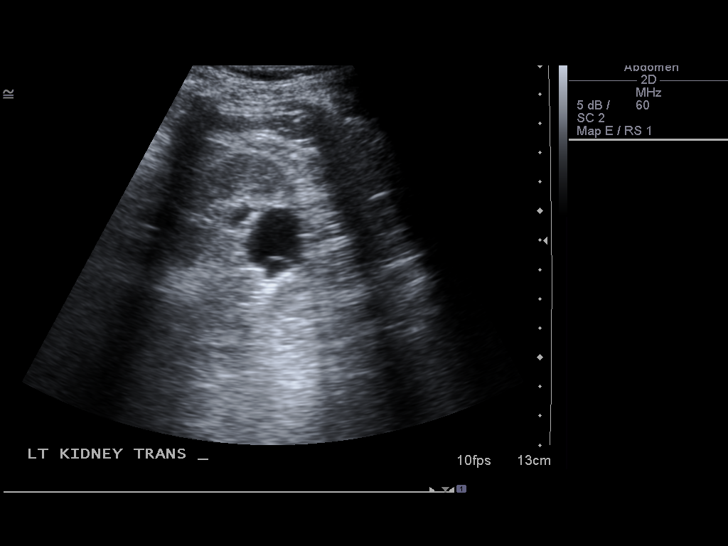
[im 73/88]
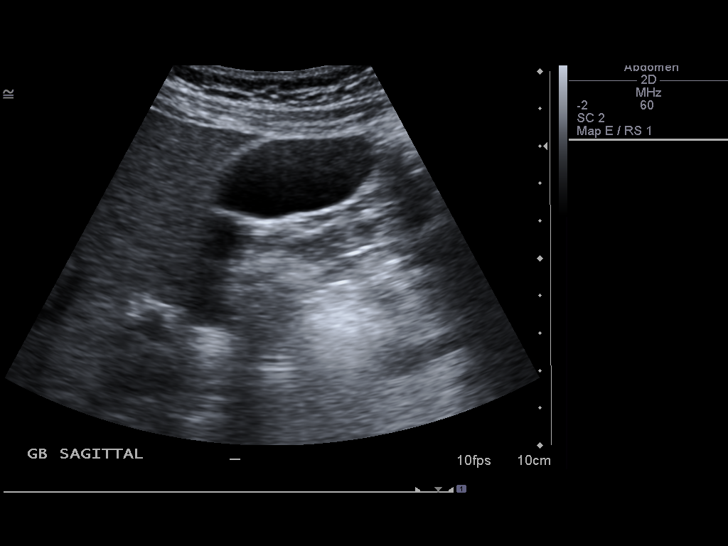
[im 80/88]
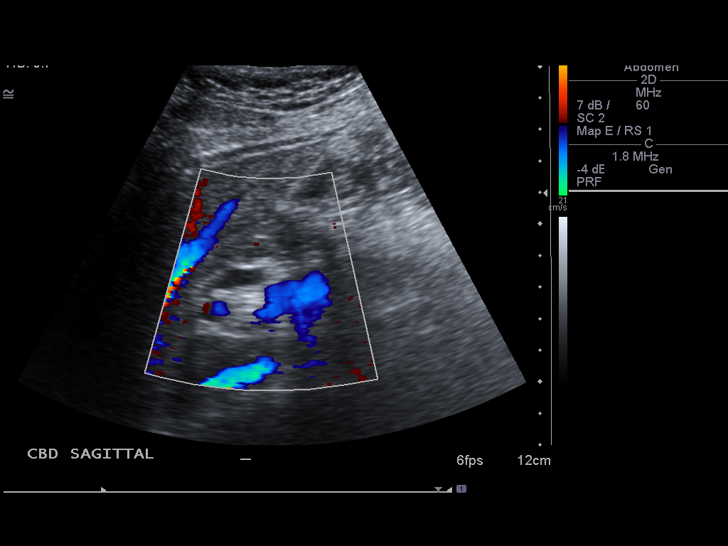
[im 88/88]
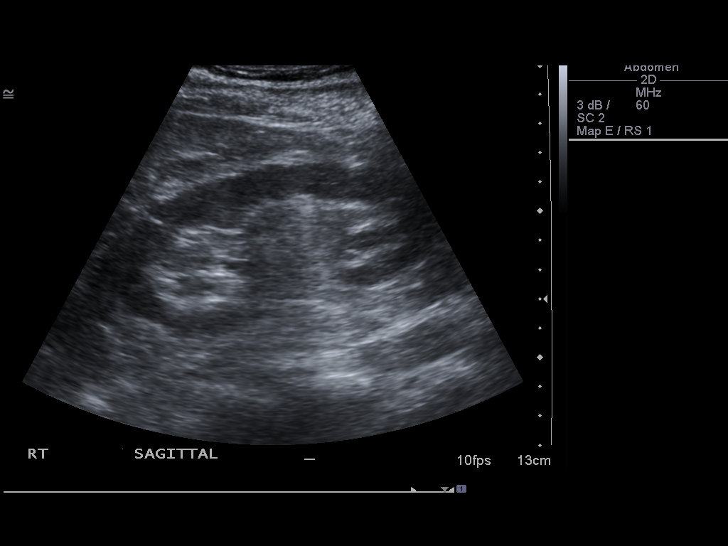

[14 of 25 positions shown; findings below may reference images not displayed]

FINDINGS: Gallbladder:  No gallstones, gallbladder wall thickening, or
pericholecystic fluid.

Common bile duct:   Within normal limits in caliber.

Liver:  No focal lesion identified.  Within normal limits in
parenchymal echogenicity.

IVC:  Appears normal.

Pancreas:  No focal abnormality seen.

Spleen:  Within normal limits in size and echotexture.

Right Kidney:   Mild cortical thinning.  Right kidney measures
cm.  8 mm cyst noted.

Left Kidney:  Cortical thinning.  No hydronephrosis.  Left kidney
measure 11.6 cm.  There is a 2.4 cm cyst in the mid to lower pole.
Thin internal septations are noted.

Abdominal aorta:  No aneurysm identified.
IMPRESSION: Bilateral renal cortical thinning.  No hydronephrosis.  Bilateral
benign-appearing renal cysts.

No acute findings.

## 2013-11-24 ENCOUNTER — Other Ambulatory Visit: Payer: Self-pay | Admitting: Endocrinology

## 2013-11-24 DIAGNOSIS — E041 Nontoxic single thyroid nodule: Secondary | ICD-10-CM

## 2013-12-07 ENCOUNTER — Ambulatory Visit
Admission: RE | Admit: 2013-12-07 | Discharge: 2013-12-07 | Disposition: A | Discharge status: Home / Self Care | Payer: Medicare Other | Source: Ambulatory Visit | Attending: Endocrinology | Admitting: Endocrinology

## 2013-12-07 ENCOUNTER — Other Ambulatory Visit (HOSPITAL_COMMUNITY)
Admission: RE | Admit: 2013-12-07 | Discharge: 2013-12-07 | Disposition: A | Discharge status: Home / Self Care | Payer: Medicare Other | Source: Ambulatory Visit | Attending: Interventional Radiology | Admitting: Interventional Radiology

## 2013-12-07 DIAGNOSIS — E041 Nontoxic single thyroid nodule: Secondary | ICD-10-CM

## 2014-11-13 IMAGING — US US THYROID BIOPSY
1 series · 13 of 25 positions shown · non-contrast
Comparison: Thyroid Ultrasound - 09/09/2013

MEDICATIONS:
None

COMPLICATIONS:
None immediate

INDICATION: Indeterminate thyroid nodules

EXAM:
ULTRASOUND GUIDED THYROID FINE NEEDLE ASPIRATION x2
TECHNIQUE: Informed written consent was obtained from the patient after a
discussion of the risks, benefits and alternatives to treatment.
Questions regarding the procedure were encouraged and answered. A
timeout was performed prior to the initiation of the procedure.

[Series 1: us thyroid biopsy · 0.08mm/px · 28 acquisitions, 13 frames shown]
[im 1/28]
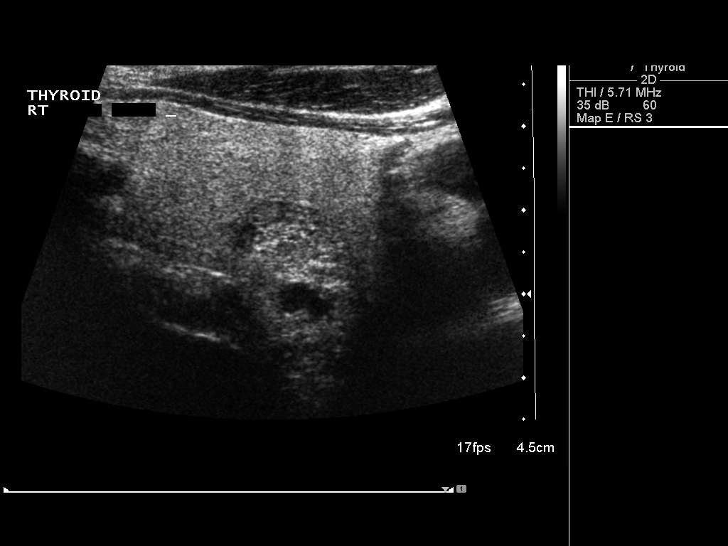
[im 3/28]
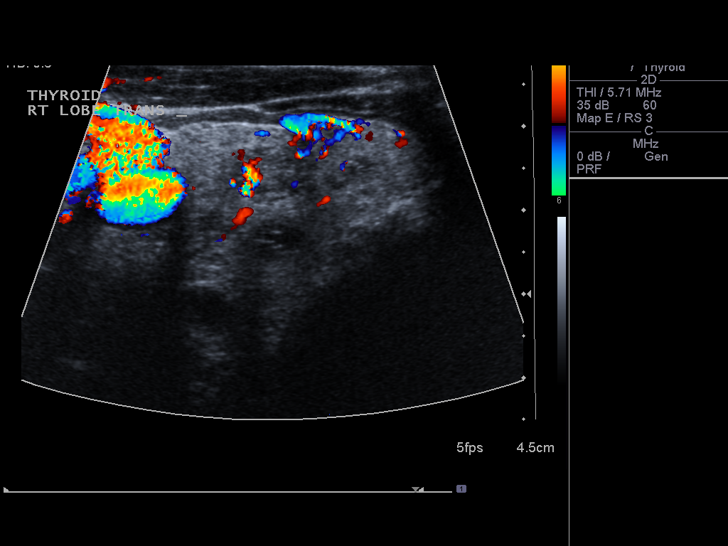
[im 5/28]
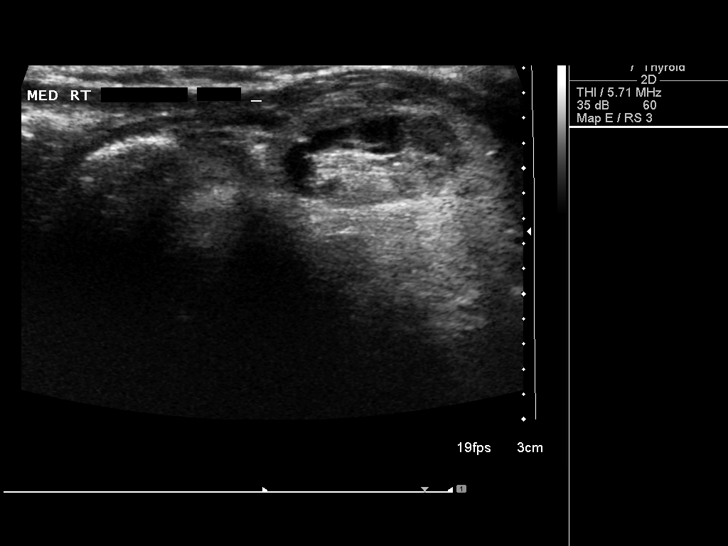
[im 7/28]
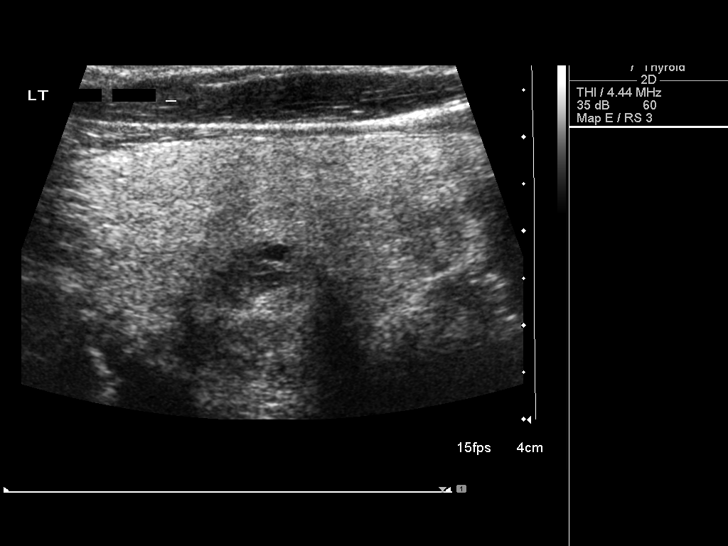
[im 10/28]
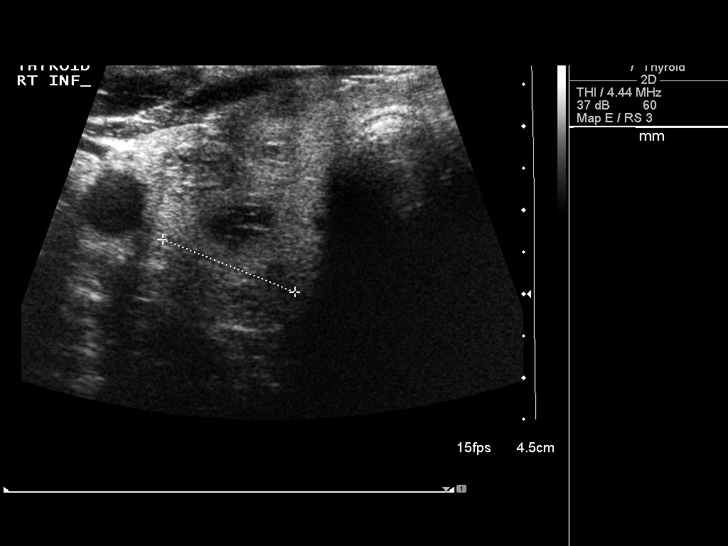
[im 12/28]
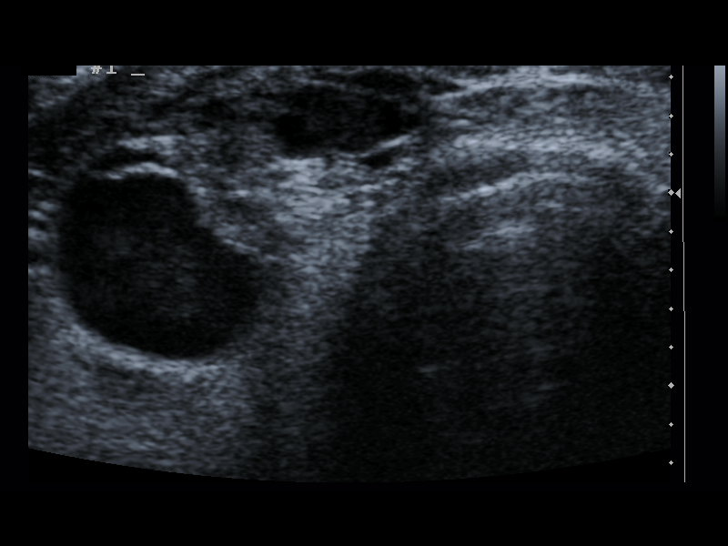
[im 14/28]
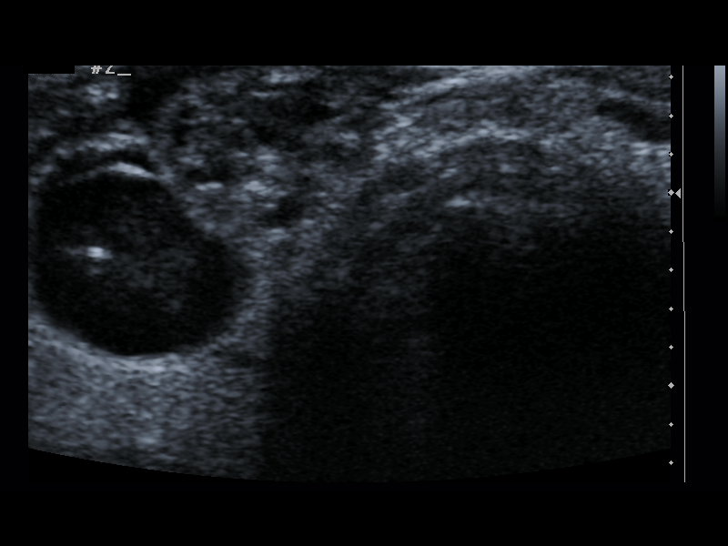
[im 16/28]
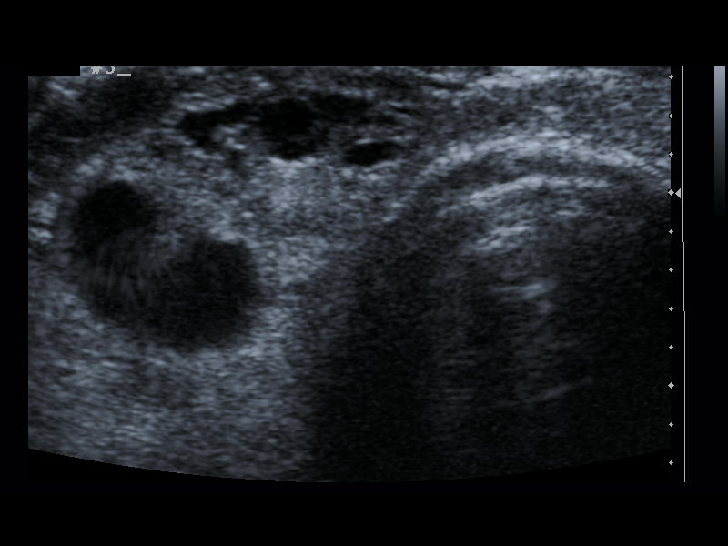
[im 19/28]
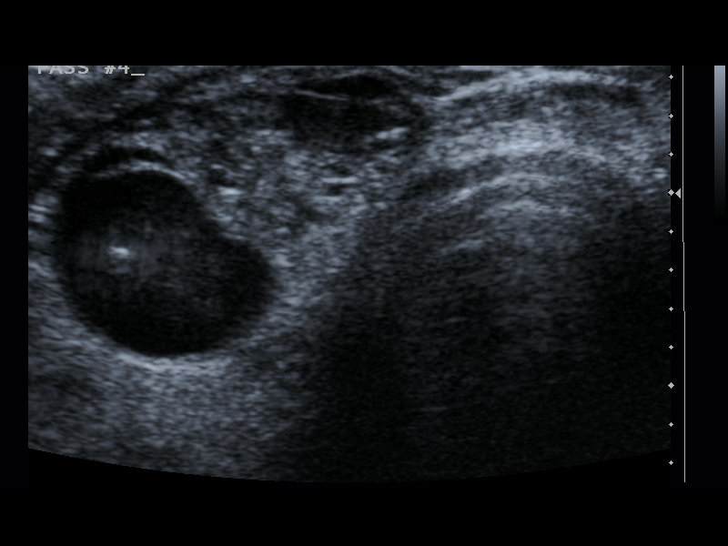
[im 21/28]
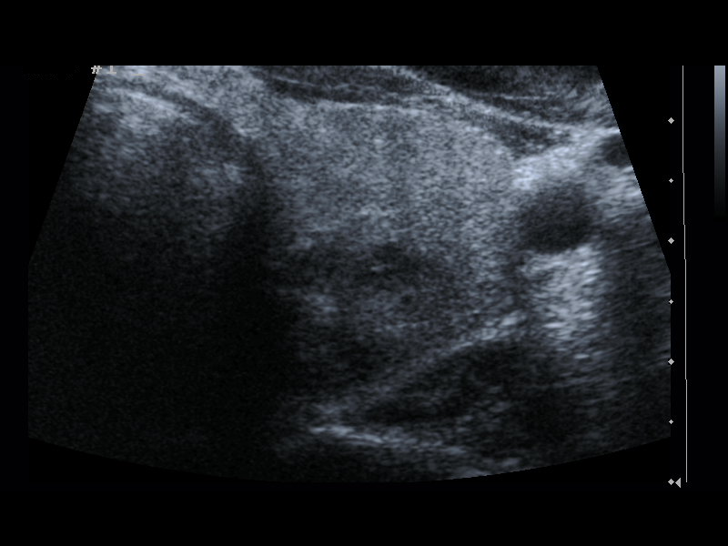
[im 23/28]
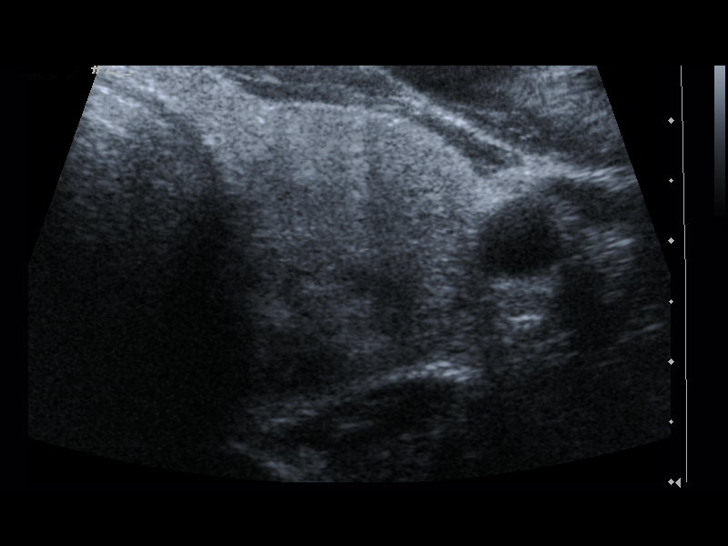
[im 25/28]
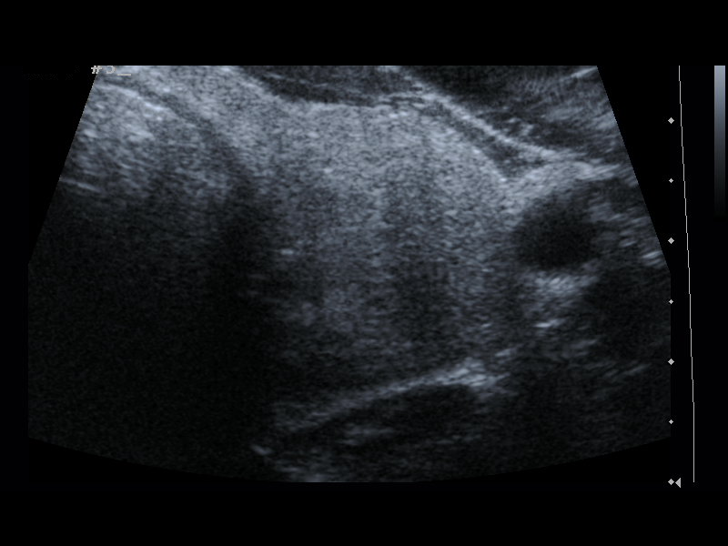
[im 28/28]
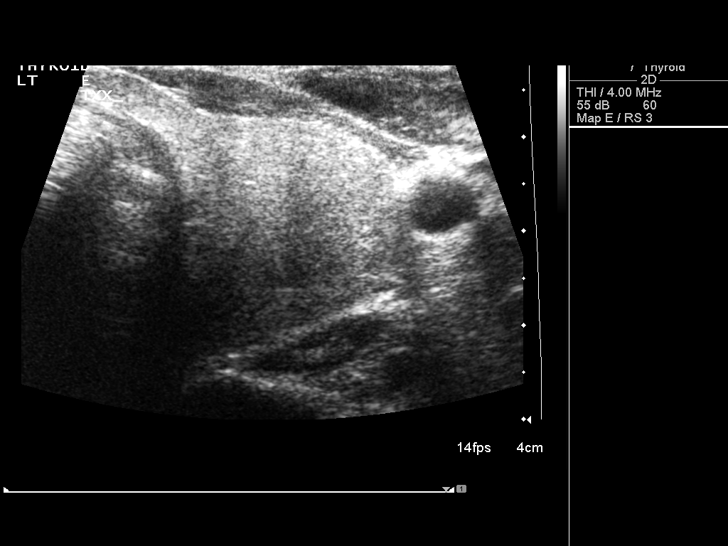

[13 of 25 positions shown; findings below may reference images not displayed]

Pre-procedural ultrasound scanning demonstrated grossly unchanged
appearance of dominant mixed echogenic, partially cystic,
predominantly solid nodule within the right side of the thyroid
isthmus as well as dominant ill-defined mixed echogenic solid nodule
within the posterior mid medial aspect of the left lobe of the
thyroid.

Real-time ultrasound evaluation performed by the dictating
radiologist failed to delineate a definitive nodule to correlate
with the previously described dominant approximately 1.7 cm nodule
within the posterior aspect of the right lobe of the thyroid and as
such this was favored to of represented a pseudo nodule.

The procedures were planned. The neck was prepped in the usual
sterile fashion, and a sterile drape was applied covering the
operative field. A timeout was performed prior to the initiation of
the procedure. Local anesthesia was provided with 1% lidocaine.

Under direct ultrasound guidance, 3 FNA biopsies were performed of
the dominant mixed echogenic partially cystic, predominantly solid
nodule within the right side of the thyroid isthmus with a 25 gauge
needle. The samples were prepared and submitted to pathology.

Under direct ultrasound guidance, 3 FNA biopsies were performed of
the dominant ill-defined solid nodule within the posterior mid
medial aspect of the left lobe of the thyroid with a 25 gauge
needle. The samples were prepared and submitted to pathology.

Limited post procedural scanning was negative for hematoma or
additional complication. Dressings were placed. The patient
tolerated the above procedures procedure well without immediate
postprocedural complication.
IMPRESSION: 1. Technically successful ultrasound guided fine needle aspiration
of dominant mixed echogenic partially cystic, predominantly solid
nodule with the right-side of the thyroid isthmus.
2. Technically successful ultrasound-guided fine needle aspiration
of dominant mixed echogenic ill-defined solid nodule within the mid
posterior medial aspect of the left lobe of the thyroid.
3. Real-time sonographic evaluation performed by the dictating
radiologist failed to delineate a definitive nodule to correlate
with the previously questioned dominant approximately once 0.7 cm
nodule within the posterior inferior aspect of the right lobe of the
thyroid and as such, this was favored to have represented a pseudo
nodule.

## 2015-02-01 ENCOUNTER — Encounter: Payer: Self-pay | Admitting: Internal Medicine

## 2015-10-02 ENCOUNTER — Other Ambulatory Visit: Payer: Self-pay | Admitting: Endocrinology

## 2015-10-02 DIAGNOSIS — E041 Nontoxic single thyroid nodule: Secondary | ICD-10-CM

## 2015-10-05 ENCOUNTER — Encounter: Payer: Self-pay | Admitting: Internal Medicine

## 2015-10-16 ENCOUNTER — Other Ambulatory Visit (HOSPITAL_COMMUNITY)
Admission: RE | Admit: 2015-10-16 | Discharge: 2015-10-16 | Disposition: A | Discharge status: Home / Self Care | Payer: Medicare Other | Source: Ambulatory Visit | Attending: Radiology | Admitting: Radiology

## 2015-10-16 ENCOUNTER — Ambulatory Visit
Admission: RE | Admit: 2015-10-16 | Discharge: 2015-10-16 | Disposition: A | Discharge status: Home / Self Care | Payer: Medicare Other | Source: Ambulatory Visit | Attending: Endocrinology | Admitting: Endocrinology

## 2015-10-16 DIAGNOSIS — E041 Nontoxic single thyroid nodule: Secondary | ICD-10-CM | POA: Insufficient documentation

## 2015-11-15 ENCOUNTER — Telehealth: Payer: Self-pay | Admitting: *Deleted

## 2015-11-15 ENCOUNTER — Ambulatory Visit (AMBULATORY_SURGERY_CENTER): Payer: Self-pay | Admitting: *Deleted

## 2015-11-15 VITALS — Ht 71.0 in | Wt 160.0 lb

## 2015-11-15 DIAGNOSIS — Z86010 Personal history of colonic polyps: Secondary | ICD-10-CM

## 2015-11-15 MED ORDER — NA SULFATE-K SULFATE-MG SULF 17.5-3.13-1.6 GM/177ML PO SOLN: 1.0000 | Freq: Once | ORAL | Status: DC

## 2015-11-15 NOTE — Progress Notes (Signed)
No egg or soy allergy. No anesthesia problems.  No home O2.  No diet meds.  No emmi given.  

## 2015-11-15 NOTE — Telephone Encounter (Signed)
Pt was seen in pre-visit 11/15/15 for colonoscopy scheduled on 3/30 at 11 am, when going over hx pt asked if you could do another EGD, he has been "belching a lot after eating and having some stomach discomfort"  2011 was last EGD when pt was having the same symptoms, dilation was completed at that time and omeprazole was prescribed, pt is no longer taking omeprazole says it didn't seem to help, Can EGD be added to colonoscopy or would you like to see pt in office first? pls adv-thanks adm

## 2015-11-16 NOTE — Telephone Encounter (Signed)
Advd pt Dr. Henrene Pastor doesn't want to schedule EGD, but will discuss his symptoms when he comes in for the colonoscopy. Pt understood and agreed with plan.

## 2015-11-16 NOTE — Telephone Encounter (Signed)
Keep colon as planned. He should remember to talk to me about his symptoms at that time. If I think he needs EGD, we can schedule it for another date after his colonoscopy. Thanks

## 2015-11-20 ENCOUNTER — Encounter: Payer: Self-pay | Admitting: Internal Medicine

## 2015-11-29 ENCOUNTER — Encounter: Payer: Self-pay | Admitting: Internal Medicine

## 2015-11-29 ENCOUNTER — Ambulatory Visit (AMBULATORY_SURGERY_CENTER): Payer: Medicare Other | Admitting: Internal Medicine

## 2015-11-29 VITALS — BP 111/62 | HR 68 | Temp 97.1°F | Resp 13 | Ht 71.0 in | Wt 160.0 lb

## 2015-11-29 DIAGNOSIS — Z86010 Personal history of colonic polyps: Secondary | ICD-10-CM | POA: Diagnosis not present

## 2015-11-29 MED ORDER — SODIUM CHLORIDE 0.9 % IV SOLN: 500.0000 mL | INTRAVENOUS | Status: DC

## 2015-11-29 NOTE — Op Note (Signed)
[place] Patient Name: Timothy Horne Procedure Date: 11/29/2015 11:25 AM MRN: [id number] Endoscopist: Docia Chuck. Henrene Pastor , MD Age: 75 Referring MD:  Date of Birth: Jan 16, 1941 Gender: Male Procedure:                Colonoscopy Indications:              High risk colon cancer surveillance: Personal                            history of non-advanced adenoma. Previous                            examinations 2005 and 2011 with small tubular                            adenomas Medicines:                Monitored Anesthesia Care Procedure:                Pre-Anesthesia Assessment:                           - Prior to the procedure, a History and Physical                            was performed, and patient medications and                            allergies were reviewed. The patient's tolerance of                            previous anesthesia was also reviewed. The risks                            and benefits of the procedure and the sedation                            options and risks were discussed with the patient.                            All questions were answered, and informed consent                            was obtained. Prior Anticoagulants: The patient has                            taken no previous anticoagulant or antiplatelet                            agents. ASA Grade Assessment: II - A patient with                            mild systemic disease. After reviewing the risks  and benefits, the patient was deemed in                            satisfactory condition to undergo the procedure.                           After obtaining informed consent, the colonoscope                            was passed under direct vision. Throughout the                            procedure, the patient's blood pressure, pulse, and                            oxygen saturations were monitored continuously. The                            Model  CF-HQ190L [id number]) scope was introduced                            through the anus and advanced to the the cecum,                            identified by appendiceal orifice and ileocecal                            valve. The colonoscopy was performed without                            difficulty. The patient tolerated the procedure                            well. The quality of the bowel preparation was                            excellent. The bowel preparation used was SUPREP.                            The ileocecal valve, appendiceal orifice, and                            rectum were photographed. Scope In: 11:37:51 AM Scope Out: 11:48:52 AM Scope Withdrawal Time: 0 hours 8 minutes 38 seconds  Total Procedure Duration: 0 hours 11 minutes 1 second  Findings:      The digital rectal exam was normal.      Diverticula were found in the sigmoid colon.      The entire examined colon appeared normal on direct and retroflexion       views. Internal hemorrhoids present. Complications:            No immediate complications. Estimated Blood Loss:     Estimated blood loss: none. Impression:               - Diverticulosis in the sigmoid colon.                           -  The entire examined colon is normal on direct and                            retroflexion views.                           - No specimens collected. Recommendation:           - Patient has a contact number available for                            emergencies. The signs and symptoms of potential                            delayed complications were discussed with the                            patient. Return to normal activities tomorrow.                            Written discharge instructions were provided to the                            patient.                           - Resume previous diet.                           - Continue present medications.                           - No recommendation at this time  regarding repeat                            colonoscopy due to age. Procedure Code(s):        --- Professional ---                           [id number], Colonoscopy, flexible; diagnostic, including                            collection of specimen(s) by brushing or washing,                            when performed (separate procedure) CPT copyright 2016 American Medical [employer]. All rights reserved. Docia Chuck. Henrene Pastor, MD 11/29/2015 11:58:00 AM This report has been signed electronically. Number of Addenda: 0 CC Letter to:             Yong Channel, MD Referring MD:      Bertell Maria, MD

## 2015-11-29 NOTE — Progress Notes (Signed)
Report given to RN, vss

## 2015-11-29 NOTE — Patient Instructions (Signed)
YOU HAD AN ENDOSCOPIC PROCEDURE TODAY AT [facility]:   Refer to the procedure report that was given to you for any specific questions about what was found during the examination.  If the procedure report does not answer your questions, please call your gastroenterologist to clarify.  If you requested that your care partner not be given the details of your procedure findings, then the procedure report has been included in a sealed envelope for you to review at your convenience later.  YOU SHOULD EXPECT: Some feelings of bloating in the abdomen. Passage of more gas than usual.  Walking can help get rid of the air that was put into your GI tract during the procedure and reduce the bloating. If you had a lower endoscopy (such as a colonoscopy or flexible sigmoidoscopy) you may notice spotting of blood in your stool or on the toilet paper. If you underwent a bowel prep for your procedure, you may not have a normal bowel movement for a few days.  Please Note:  You might notice some irritation and congestion in your nose or some drainage.  This is from the oxygen used during your procedure.  There is no need for concern and it should clear up in a day or so.  SYMPTOMS TO REPORT IMMEDIATELY:   Following lower endoscopy (colonoscopy or flexible sigmoidoscopy):  Excessive amounts of blood in the stool  Significant tenderness or worsening of abdominal pains  Swelling of the abdomen that is new, acute  Fever of 100F or higher   For urgent or emergent issues, a gastroenterologist can be reached at any hour by calling [phone].   DIET: Your first meal following the procedure should be a small meal and then it is ok to progress to your normal diet. Heavy or fried foods are harder to digest and may make you feel nauseous or bloated.  Likewise, meals heavy in dairy and vegetables can increase bloating.  Drink plenty of fluids but you should avoid alcoholic beverages for 24  hours.  ACTIVITY:  You should plan to take it easy for the rest of today and you should NOT DRIVE or use heavy machinery until tomorrow (because of the sedation medicines used during the test).    FOLLOW UP: Our staff will call the number listed on your records the next business day following your procedure to check on you and address any questions or concerns that you may have regarding the information given to you following your procedure. If we do not reach you, we will leave a message.  However, if you are feeling well and you are not experiencing any problems, there is no need to return our call.  We will assume that you have returned to your regular daily activities without incident.  If any biopsies were taken you will be contacted by phone or by letter within the next 1-3 weeks.  Please call us at [phone] if you have not heard about the biopsies in 3 weeks.    SIGNATURES/CONFIDENTIALITY: You and/or your care partner have signed paperwork which will be entered into your electronic medical record.  These signatures attest to the fact that that the information above on your After Visit Summary has been reviewed and is understood.  Full responsibility of the confidentiality of this discharge information lies with you and/or your care-partner.  Polyps/Diverticulosis handout given Await pathology results Resume medications and diet

## 2015-11-30 ENCOUNTER — Telehealth: Payer: Self-pay | Admitting: *Deleted

## 2015-11-30 NOTE — Telephone Encounter (Signed)
  Follow up Call-  Call back number 11/29/2015  Post procedure Call Back phone  # [phone]  Permission to leave phone message Yes     Patient questions:  Message left to call us if necessary.

## 2018-09-15 DIAGNOSIS — E0789 Other specified disorders of thyroid: Secondary | ICD-10-CM | POA: Diagnosis not present

## 2018-09-15 DIAGNOSIS — E010 Iodine-deficiency related diffuse (endemic) goiter: Secondary | ICD-10-CM | POA: Diagnosis not present

## 2018-09-15 DIAGNOSIS — E042 Nontoxic multinodular goiter: Secondary | ICD-10-CM | POA: Diagnosis not present

## 2018-09-24 DIAGNOSIS — E042 Nontoxic multinodular goiter: Secondary | ICD-10-CM | POA: Diagnosis not present

## 2018-09-24 DIAGNOSIS — E041 Nontoxic single thyroid nodule: Secondary | ICD-10-CM | POA: Diagnosis not present

## 2018-09-28 DIAGNOSIS — H4922 Sixth [abducent] nerve palsy, left eye: Secondary | ICD-10-CM | POA: Diagnosis not present

## 2018-09-28 DIAGNOSIS — H4911 Fourth [trochlear] nerve palsy, right eye: Secondary | ICD-10-CM | POA: Diagnosis not present

## 2018-10-27 DIAGNOSIS — R002 Palpitations: Secondary | ICD-10-CM | POA: Diagnosis not present

## 2018-10-27 DIAGNOSIS — I10 Essential (primary) hypertension: Secondary | ICD-10-CM | POA: Diagnosis not present

## 2018-10-27 DIAGNOSIS — G43001 Migraine without aura, not intractable, with status migrainosus: Secondary | ICD-10-CM | POA: Diagnosis not present

## 2018-10-27 DIAGNOSIS — E042 Nontoxic multinodular goiter: Secondary | ICD-10-CM | POA: Diagnosis not present

## 2018-10-27 DIAGNOSIS — K222 Esophageal obstruction: Secondary | ICD-10-CM | POA: Diagnosis not present

## 2018-10-27 DIAGNOSIS — E785 Hyperlipidemia, unspecified: Secondary | ICD-10-CM | POA: Diagnosis not present

## 2019-01-26 DIAGNOSIS — K219 Gastro-esophageal reflux disease without esophagitis: Secondary | ICD-10-CM | POA: Diagnosis not present

## 2019-01-26 DIAGNOSIS — K222 Esophageal obstruction: Secondary | ICD-10-CM | POA: Diagnosis not present

## 2019-01-26 DIAGNOSIS — I10 Essential (primary) hypertension: Secondary | ICD-10-CM | POA: Diagnosis not present

## 2019-01-26 DIAGNOSIS — I498 Other specified cardiac arrhythmias: Secondary | ICD-10-CM | POA: Diagnosis not present

## 2019-01-26 DIAGNOSIS — E042 Nontoxic multinodular goiter: Secondary | ICD-10-CM | POA: Diagnosis not present

## 2019-01-26 DIAGNOSIS — B0221 Postherpetic geniculate ganglionitis: Secondary | ICD-10-CM | POA: Diagnosis not present

## 2019-02-11 DIAGNOSIS — I10 Essential (primary) hypertension: Secondary | ICD-10-CM | POA: Diagnosis not present

## 2019-02-11 DIAGNOSIS — E042 Nontoxic multinodular goiter: Secondary | ICD-10-CM | POA: Diagnosis not present

## 2019-02-11 DIAGNOSIS — I498 Other specified cardiac arrhythmias: Secondary | ICD-10-CM | POA: Diagnosis not present

## 2019-02-11 DIAGNOSIS — Z125 Encounter for screening for malignant neoplasm of prostate: Secondary | ICD-10-CM | POA: Diagnosis not present

## 2019-02-11 DIAGNOSIS — Z0000 Encounter for general adult medical examination without abnormal findings: Secondary | ICD-10-CM | POA: Diagnosis not present

## 2019-02-11 DIAGNOSIS — Z5181 Encounter for therapeutic drug level monitoring: Secondary | ICD-10-CM | POA: Diagnosis not present

## 2019-02-11 DIAGNOSIS — G43909 Migraine, unspecified, not intractable, without status migrainosus: Secondary | ICD-10-CM | POA: Diagnosis not present

## 2019-02-11 DIAGNOSIS — F419 Anxiety disorder, unspecified: Secondary | ICD-10-CM | POA: Diagnosis not present

## 2019-08-17 DIAGNOSIS — E785 Hyperlipidemia, unspecified: Secondary | ICD-10-CM | POA: Diagnosis not present

## 2019-08-17 DIAGNOSIS — G43909 Migraine, unspecified, not intractable, without status migrainosus: Secondary | ICD-10-CM | POA: Diagnosis not present

## 2019-08-17 DIAGNOSIS — I10 Essential (primary) hypertension: Secondary | ICD-10-CM | POA: Diagnosis not present

## 2019-08-17 DIAGNOSIS — F419 Anxiety disorder, unspecified: Secondary | ICD-10-CM | POA: Diagnosis not present

## 2019-08-17 DIAGNOSIS — E042 Nontoxic multinodular goiter: Secondary | ICD-10-CM | POA: Diagnosis not present

## 2019-08-17 DIAGNOSIS — Z5181 Encounter for therapeutic drug level monitoring: Secondary | ICD-10-CM | POA: Diagnosis not present

## 2019-12-23 DIAGNOSIS — E042 Nontoxic multinodular goiter: Secondary | ICD-10-CM | POA: Diagnosis not present

## 2019-12-30 DIAGNOSIS — E042 Nontoxic multinodular goiter: Secondary | ICD-10-CM | POA: Diagnosis not present

## 2020-02-13 DIAGNOSIS — I10 Essential (primary) hypertension: Secondary | ICD-10-CM | POA: Diagnosis not present

## 2020-02-13 DIAGNOSIS — E042 Nontoxic multinodular goiter: Secondary | ICD-10-CM | POA: Diagnosis not present

## 2020-02-13 DIAGNOSIS — Z5181 Encounter for therapeutic drug level monitoring: Secondary | ICD-10-CM | POA: Diagnosis not present

## 2020-02-14 DIAGNOSIS — E042 Nontoxic multinodular goiter: Secondary | ICD-10-CM | POA: Diagnosis not present

## 2020-02-14 DIAGNOSIS — Z5181 Encounter for therapeutic drug level monitoring: Secondary | ICD-10-CM | POA: Diagnosis not present

## 2020-02-14 DIAGNOSIS — F419 Anxiety disorder, unspecified: Secondary | ICD-10-CM | POA: Diagnosis not present

## 2020-02-14 DIAGNOSIS — E785 Hyperlipidemia, unspecified: Secondary | ICD-10-CM | POA: Diagnosis not present

## 2020-02-14 DIAGNOSIS — E559 Vitamin D deficiency, unspecified: Secondary | ICD-10-CM | POA: Diagnosis not present

## 2020-02-14 DIAGNOSIS — I10 Essential (primary) hypertension: Secondary | ICD-10-CM | POA: Diagnosis not present

## 2020-02-14 DIAGNOSIS — Z8669 Personal history of other diseases of the nervous system and sense organs: Secondary | ICD-10-CM | POA: Diagnosis not present

## 2020-08-07 DIAGNOSIS — G43909 Migraine, unspecified, not intractable, without status migrainosus: Secondary | ICD-10-CM | POA: Diagnosis not present

## 2020-08-07 DIAGNOSIS — E042 Nontoxic multinodular goiter: Secondary | ICD-10-CM | POA: Diagnosis not present

## 2020-08-07 DIAGNOSIS — F419 Anxiety disorder, unspecified: Secondary | ICD-10-CM | POA: Diagnosis not present

## 2020-08-07 DIAGNOSIS — I10 Essential (primary) hypertension: Secondary | ICD-10-CM | POA: Diagnosis not present

## 2020-08-07 DIAGNOSIS — Z5181 Encounter for therapeutic drug level monitoring: Secondary | ICD-10-CM | POA: Diagnosis not present

## 2020-08-07 DIAGNOSIS — E785 Hyperlipidemia, unspecified: Secondary | ICD-10-CM | POA: Diagnosis not present

## 2020-10-18 DIAGNOSIS — I499 Cardiac arrhythmia, unspecified: Secondary | ICD-10-CM | POA: Diagnosis not present

## 2020-10-18 DIAGNOSIS — K4090 Unilateral inguinal hernia, without obstruction or gangrene, not specified as recurrent: Secondary | ICD-10-CM | POA: Diagnosis not present

## 2020-10-23 DIAGNOSIS — L570 Actinic keratosis: Secondary | ICD-10-CM | POA: Diagnosis not present

## 2020-10-23 DIAGNOSIS — L300 Nummular dermatitis: Secondary | ICD-10-CM | POA: Diagnosis not present

## 2020-10-23 DIAGNOSIS — L299 Pruritus, unspecified: Secondary | ICD-10-CM | POA: Diagnosis not present

## 2020-10-31 DIAGNOSIS — I499 Cardiac arrhythmia, unspecified: Secondary | ICD-10-CM | POA: Diagnosis not present

## 2020-10-31 DIAGNOSIS — Z01818 Encounter for other preprocedural examination: Secondary | ICD-10-CM | POA: Diagnosis not present

## 2020-11-23 DIAGNOSIS — K4090 Unilateral inguinal hernia, without obstruction or gangrene, not specified as recurrent: Secondary | ICD-10-CM | POA: Diagnosis not present

## 2020-11-23 DIAGNOSIS — D176 Benign lipomatous neoplasm of spermatic cord: Secondary | ICD-10-CM | POA: Diagnosis not present

## 2020-11-23 DIAGNOSIS — K4091 Unilateral inguinal hernia, without obstruction or gangrene, recurrent: Secondary | ICD-10-CM | POA: Diagnosis not present

## 2020-12-25 DIAGNOSIS — E042 Nontoxic multinodular goiter: Secondary | ICD-10-CM | POA: Diagnosis not present

## 2021-05-15 DIAGNOSIS — K219 Gastro-esophageal reflux disease without esophagitis: Secondary | ICD-10-CM | POA: Diagnosis not present

## 2021-05-15 DIAGNOSIS — Z86010 Personal history of colonic polyps: Secondary | ICD-10-CM | POA: Diagnosis not present

## 2021-05-15 DIAGNOSIS — R1319 Other dysphagia: Secondary | ICD-10-CM | POA: Diagnosis not present

## 2021-05-15 DIAGNOSIS — R634 Abnormal weight loss: Secondary | ICD-10-CM | POA: Diagnosis not present

## 2021-06-13 DIAGNOSIS — K2289 Other specified disease of esophagus: Secondary | ICD-10-CM | POA: Diagnosis not present

## 2021-06-13 DIAGNOSIS — K635 Polyp of colon: Secondary | ICD-10-CM | POA: Diagnosis not present

## 2021-06-13 DIAGNOSIS — K648 Other hemorrhoids: Secondary | ICD-10-CM | POA: Diagnosis not present

## 2021-06-13 DIAGNOSIS — K621 Rectal polyp: Secondary | ICD-10-CM | POA: Diagnosis not present

## 2021-06-13 DIAGNOSIS — R634 Abnormal weight loss: Secondary | ICD-10-CM | POA: Diagnosis not present

## 2021-06-13 DIAGNOSIS — K219 Gastro-esophageal reflux disease without esophagitis: Secondary | ICD-10-CM | POA: Diagnosis not present

## 2021-06-13 DIAGNOSIS — D123 Benign neoplasm of transverse colon: Secondary | ICD-10-CM | POA: Diagnosis not present

## 2021-06-13 DIAGNOSIS — D120 Benign neoplasm of cecum: Secondary | ICD-10-CM | POA: Diagnosis not present

## 2021-06-13 DIAGNOSIS — Z86010 Personal history of colonic polyps: Secondary | ICD-10-CM | POA: Diagnosis not present

## 2021-06-13 DIAGNOSIS — R1310 Dysphagia, unspecified: Secondary | ICD-10-CM | POA: Diagnosis not present

## 2021-06-13 DIAGNOSIS — K5730 Diverticulosis of large intestine without perforation or abscess without bleeding: Secondary | ICD-10-CM | POA: Diagnosis not present

## 2021-06-13 DIAGNOSIS — R1319 Other dysphagia: Secondary | ICD-10-CM | POA: Diagnosis not present

## 2021-06-13 DIAGNOSIS — D128 Benign neoplasm of rectum: Secondary | ICD-10-CM | POA: Diagnosis not present

## 2021-08-07 DIAGNOSIS — E042 Nontoxic multinodular goiter: Secondary | ICD-10-CM | POA: Diagnosis not present

## 2021-08-07 DIAGNOSIS — F419 Anxiety disorder, unspecified: Secondary | ICD-10-CM | POA: Diagnosis not present

## 2021-08-07 DIAGNOSIS — I10 Essential (primary) hypertension: Secondary | ICD-10-CM | POA: Diagnosis not present

## 2021-08-07 DIAGNOSIS — G43909 Migraine, unspecified, not intractable, without status migrainosus: Secondary | ICD-10-CM | POA: Diagnosis not present

## 2021-08-07 DIAGNOSIS — E785 Hyperlipidemia, unspecified: Secondary | ICD-10-CM | POA: Diagnosis not present

## 2021-08-07 DIAGNOSIS — E559 Vitamin D deficiency, unspecified: Secondary | ICD-10-CM | POA: Diagnosis not present

## 2021-08-07 DIAGNOSIS — Z8249 Family history of ischemic heart disease and other diseases of the circulatory system: Secondary | ICD-10-CM | POA: Diagnosis not present

## 2021-08-07 DIAGNOSIS — Z5181 Encounter for therapeutic drug level monitoring: Secondary | ICD-10-CM | POA: Diagnosis not present

## 2021-08-13 DIAGNOSIS — E785 Hyperlipidemia, unspecified: Secondary | ICD-10-CM | POA: Diagnosis not present

## 2021-08-13 DIAGNOSIS — Z8249 Family history of ischemic heart disease and other diseases of the circulatory system: Secondary | ICD-10-CM | POA: Diagnosis not present

## 2021-08-13 DIAGNOSIS — I10 Essential (primary) hypertension: Secondary | ICD-10-CM | POA: Diagnosis not present
